# Patient Record
Sex: Male | Born: 1960 | Race: White | Hispanic: No | Marital: Married | Smoking: Current every day smoker
Health system: Southern US, Community
[De-identification: ages and names within clinical notes are randomized; demographics above are authoritative.]

## PROBLEM LIST (undated history)

## (undated) DIAGNOSIS — F329 Major depressive disorder, single episode, unspecified: Secondary | ICD-10-CM

## (undated) DIAGNOSIS — K635 Polyp of colon: Secondary | ICD-10-CM

## (undated) DIAGNOSIS — I209 Angina pectoris, unspecified: Secondary | ICD-10-CM

## (undated) DIAGNOSIS — F32A Depression, unspecified: Secondary | ICD-10-CM

## (undated) DIAGNOSIS — R519 Headache, unspecified: Secondary | ICD-10-CM

## (undated) DIAGNOSIS — K219 Gastro-esophageal reflux disease without esophagitis: Secondary | ICD-10-CM

## (undated) DIAGNOSIS — B019 Varicella without complication: Secondary | ICD-10-CM

## (undated) DIAGNOSIS — R51 Headache: Secondary | ICD-10-CM

## (undated) DIAGNOSIS — F419 Anxiety disorder, unspecified: Secondary | ICD-10-CM

## (undated) HISTORY — DX: Anxiety disorder, unspecified: F41.9

## (undated) HISTORY — DX: Headache, unspecified: R51.9

## (undated) HISTORY — PX: OTHER SURGICAL HISTORY: SHX169

## (undated) HISTORY — DX: Gastro-esophageal reflux disease without esophagitis: K21.9

## (undated) HISTORY — DX: Polyp of colon: K63.5

## (undated) HISTORY — DX: Varicella without complication: B01.9

## (undated) HISTORY — DX: Headache: R51

---

## 2001-07-23 ENCOUNTER — Encounter: Payer: Self-pay | Admitting: Internal Medicine

## 2001-07-23 ENCOUNTER — Ambulatory Visit (HOSPITAL_COMMUNITY): Admission: RE | Admit: 2001-07-23 | Discharge: 2001-07-23 | Payer: Self-pay | Admitting: Internal Medicine

## 2004-07-09 ENCOUNTER — Emergency Department (HOSPITAL_COMMUNITY): Admission: EM | Admit: 2004-07-09 | Discharge: 2004-07-10 | Payer: Self-pay | Admitting: Internal Medicine

## 2004-07-10 ENCOUNTER — Emergency Department (HOSPITAL_COMMUNITY): Admission: EM | Admit: 2004-07-10 | Discharge: 2004-07-10 | Payer: Self-pay | Admitting: Family Medicine

## 2005-07-16 ENCOUNTER — Encounter: Admission: RE | Admit: 2005-07-16 | Discharge: 2005-07-16 | Payer: Self-pay | Admitting: Occupational Medicine

## 2005-12-24 ENCOUNTER — Encounter: Admission: RE | Admit: 2005-12-24 | Discharge: 2005-12-24 | Payer: Self-pay | Admitting: Emergency Medicine

## 2008-09-14 DIAGNOSIS — K635 Polyp of colon: Secondary | ICD-10-CM

## 2008-09-14 HISTORY — DX: Polyp of colon: K63.5

## 2008-10-28 LAB — HM COLONOSCOPY

## 2009-01-20 ENCOUNTER — Emergency Department (HOSPITAL_COMMUNITY): Admission: EM | Admit: 2009-01-20 | Discharge: 2009-01-20 | Payer: Self-pay | Admitting: Emergency Medicine

## 2011-10-29 ENCOUNTER — Encounter: Payer: Self-pay | Admitting: Family Medicine

## 2011-10-29 ENCOUNTER — Ambulatory Visit (INDEPENDENT_AMBULATORY_CARE_PROVIDER_SITE_OTHER): Payer: BC Managed Care – PPO | Admitting: Family Medicine

## 2011-10-29 VITALS — BP 150/78 | HR 100 | Temp 98.8°F | Resp 78 | Ht 70.5 in | Wt 244.0 lb

## 2011-10-29 DIAGNOSIS — D126 Benign neoplasm of colon, unspecified: Secondary | ICD-10-CM

## 2011-10-29 DIAGNOSIS — F419 Anxiety disorder, unspecified: Secondary | ICD-10-CM

## 2011-10-29 DIAGNOSIS — F411 Generalized anxiety disorder: Secondary | ICD-10-CM

## 2011-10-29 DIAGNOSIS — K635 Polyp of colon: Secondary | ICD-10-CM

## 2011-10-29 DIAGNOSIS — K219 Gastro-esophageal reflux disease without esophagitis: Secondary | ICD-10-CM

## 2011-10-29 MED ORDER — SERTRALINE HCL 50 MG PO TABS: 50.0000 mg | ORAL_TABLET | Freq: Every day | ORAL | Status: DC

## 2011-10-29 NOTE — Progress Notes (Signed)
  Subjective:    Patient ID: Thomas Curtis, male    DOB: 1961/04/27, 51 y.o.   MRN: [id number]  HPI  New patient to establish care. Patient has history of GERD, benign colon polyps, ongoing nicotine use, and chronic anxiety. Was treated by previous physician with Celexa and as needed use of alprazolam. Has been off medication for about one year but is having some recent problems with increased anxiety. He relates some of this to work but also family stressors. Initially seemed to get good benefit from Celexa but this seemed to lose benefit over time. He denies history of depression. No history of alcohol or illicit drug abuse.  Long history of GERD. Drinks about 3 cups coffee per day. Also smokes one pack cigarettes per day. Occasionally alcohol 5-6 beers per day he but does not drink most days a week. No dysphagia.  Remote history of hematochezia. Colonoscopy revealed benign colon polyps. Positive family history of colon cancer in father.   Review of Systems  Constitutional: Negative for fever, chills, activity change and appetite change.  Respiratory: Negative for cough and shortness of breath.   Cardiovascular: Negative for chest pain, palpitations and leg swelling.  Gastrointestinal: Negative for abdominal pain.  Genitourinary: Negative for dysuria.  Neurological: Negative for dizziness and syncope.  Hematological: Negative for adenopathy.  Psychiatric/Behavioral: Negative for confusion.       Objective:   Physical Exam  Constitutional: He appears well-developed and well-nourished.  Neck: Neck supple. No thyromegaly present.  Cardiovascular: Normal rate and regular rhythm.   No murmur heard. Pulmonary/Chest: Effort normal and breath sounds normal. No respiratory distress. He has no wheezes. He has no rales.  Musculoskeletal: He exhibits no edema.  Lymphadenopathy:    He has no cervical adenopathy.  Psychiatric: He has a normal mood and affect. His behavior is normal.           Assessment & Plan:  #1 history of chronic anxiety. Question generalized anxiety. Start sertraline 50 mg daily. Reviewed possible side effects. Reassess one month  #2 history of chronic GERD. Discussed lifestyle modification. Scale back caffeine use.  Discussed importance of smoking cessation and alcohol in moderation.  #3 history of benign colon polyps  #4 history of chronic nicotine use. Patient has low motivation to stop at this time

## 2011-11-26 ENCOUNTER — Ambulatory Visit (INDEPENDENT_AMBULATORY_CARE_PROVIDER_SITE_OTHER): Payer: BC Managed Care – PPO | Admitting: Family Medicine

## 2011-11-26 ENCOUNTER — Encounter: Payer: Self-pay | Admitting: Family Medicine

## 2011-11-26 VITALS — BP 130/80 | HR 80 | Temp 98.4°F | Resp 12 | Ht 71.0 in | Wt 238.0 lb

## 2011-11-26 DIAGNOSIS — Z0000 Encounter for general adult medical examination without abnormal findings: Secondary | ICD-10-CM

## 2011-11-26 LAB — BASIC METABOLIC PANEL
BUN: 17 mg/dL (ref 6–23)
Calcium: 9.6 mg/dL (ref 8.4–10.5)
Chloride: 107 mEq/L (ref 96–112)
GFR: 61.04 mL/min (ref >60.00)
Glucose, Bld: 91 mg/dL (ref 70–99)
Potassium: 4.5 mEq/L (ref 3.5–5.1)
Sodium: 137 mEq/L (ref 135–145)

## 2011-11-26 LAB — CBC WITH DIFFERENTIAL/PLATELET
Basophils Absolute: 0 10*3/uL (ref 0.0–0.1)
Basophils Relative: 0.5 % (ref 0.0–3.0)
Eosinophils Absolute: 0.1 10*3/uL (ref 0.0–0.7)
Eosinophils Relative: 1.1 % (ref 0.0–5.0)
HCT: 45.9 % (ref 39.0–52.0)
Lymphocytes Relative: 21.7 % (ref 12.0–46.0)
Lymphs Abs: 1.3 10*3/uL (ref 0.7–4.0)
MCHC: 33.7 g/dL (ref 30.0–36.0)
MCV: 93 fl (ref 78.0–100.0)
Monocytes Absolute: 0.6 10*3/uL (ref 0.1–1.0)
Monocytes Relative: 9.6 % (ref 3.0–12.0)
Neutro Abs: 4.1 10*3/uL (ref 1.4–7.7)
Neutrophils Relative %: 67.1 % (ref 43.0–77.0)
RBC: 4.93 Mil/uL (ref 4.22–5.81)
RDW: 13.3 % (ref 11.5–14.6)
WBC: 6.1 10*3/uL (ref 4.5–10.5)

## 2011-11-26 LAB — LIPID PANEL
Cholesterol: 210 mg/dL — ABNORMAL HIGH (ref 0–200)
HDL: 46.4 mg/dL (ref >39.00)
Total CHOL/HDL Ratio: 5
VLDL: 13.2 mg/dL (ref 0.0–40.0)

## 2011-11-26 LAB — HEPATIC FUNCTION PANEL
ALT: 24 U/L (ref 0–53)
AST: 26 U/L (ref 0–37)
Albumin: 4.4 g/dL (ref 3.5–5.2)
Alkaline Phosphatase: 73 U/L (ref 39–117)
Bilirubin, Direct: 0.1 mg/dL (ref 0.0–0.3)
Total Bilirubin: 0.6 mg/dL (ref 0.3–1.2)
Total Protein: 7 g/dL (ref 6.0–8.3)

## 2011-11-26 LAB — TSH: TSH: 1.16 u[IU]/mL (ref 0.35–5.50)

## 2011-11-26 LAB — LDL CHOLESTEROL, DIRECT: Direct LDL: 141.1 mg/dL

## 2011-11-26 LAB — PSA: PSA: 0.39 ng/mL (ref 0.10–4.00)

## 2011-11-26 MED ORDER — PANTOPRAZOLE SODIUM 40 MG PO TBEC: 40.0000 mg | DELAYED_RELEASE_TABLET | Freq: Every day | ORAL | Status: DC

## 2011-11-26 NOTE — Progress Notes (Signed)
  Subjective:    Patient ID: Thomas Curtis, male    DOB: 1961/01/21, 51 y.o.   MRN: [id number]  HPI  Complete physical. Patient seen last visit with anxiety and greatly improved with sertraline. His early morning vomiting went completely away after starting sertraline. He feels less anxious. Sleep improved. Less irritable. Long history of reflux currently treated omeprazole. He would like to consider generic prescription options. Reflux well controlled. Still smokes about one pack per day and considering over the counter assistance such as nicotine gum or patches.  Father had colon cancer. Patient's last colonoscopy 2010. He has had previous polyps. He thinks he is due for repeat this year. Patient declines Pneumovax. Tetanus 2 years ago.  Past Medical History  Diagnosis Date  . Chicken pox   . GERD (gastroesophageal reflux disease)   . Colon polyps 2010  . Anxiety    No past surgical history on file.  reports that he has been smoking Cigarettes.  He has a 35 pack-year smoking history. He does not have any smokeless tobacco history on file. His alcohol and drug histories not on file. family history includes Cancer in his father and Hypertension in his father and mother. No Known Allergies    Review of Systems  Constitutional: Negative for fever, activity change, appetite change and fatigue.  HENT: Negative for ear pain, congestion and trouble swallowing.   Eyes: Negative for pain and visual disturbance.  Respiratory: Negative for cough, shortness of breath and wheezing.   Cardiovascular: Negative for chest pain and palpitations.  Gastrointestinal: Negative for nausea, vomiting, abdominal pain, diarrhea, constipation, blood in stool, abdominal distention and rectal pain.  Genitourinary: Negative for dysuria, hematuria and testicular pain.  Musculoskeletal: Negative for joint swelling and arthralgias.  Skin: Negative for rash.  Neurological: Negative for dizziness, syncope and  headaches.  Hematological: Negative for adenopathy.  Psychiatric/Behavioral: Negative for confusion and dysphoric mood.       Objective:   Physical Exam  Constitutional: He is oriented to person, place, and time. He appears well-developed and well-nourished. No distress.  HENT:  Head: Normocephalic and atraumatic.  Right Ear: External ear normal.  Left Ear: External ear normal.  Mouth/Throat: Oropharynx is clear and moist.  Eyes: Conjunctivae and EOM are normal. Pupils are equal, round, and reactive to light.  Neck: Normal range of motion. Neck supple. No thyromegaly present.  Cardiovascular: Normal rate, regular rhythm and normal heart sounds.   No murmur heard. Pulmonary/Chest: No respiratory distress. He has no wheezes. He has no rales.  Abdominal: Soft. Bowel sounds are normal. He exhibits no distension and no mass. There is no tenderness. There is no rebound and no guarding.  Musculoskeletal: He exhibits no edema.  Lymphadenopathy:    He has no cervical adenopathy.  Neurological: He is alert and oriented to person, place, and time. He displays normal reflexes. No cranial nerve deficit.  Skin: No rash noted.  Psychiatric: He has a normal mood and affect. His behavior is normal. Judgment and thought content normal.          Assessment & Plan:  Complete physical. Patient encouraged to continue with regular colonoscopies as recommended by GI. Smoking cessation discussed. He is considering over-the-counter methods. Pneumovax recommended but declined. Change to Protonix 40 mg once daily

## 2011-11-27 NOTE — Progress Notes (Signed)
Quick Note:  Pt informed on personally identified VM ______ 

## 2012-02-12 ENCOUNTER — Encounter: Payer: Self-pay | Admitting: Family Medicine

## 2012-02-12 ENCOUNTER — Ambulatory Visit (INDEPENDENT_AMBULATORY_CARE_PROVIDER_SITE_OTHER): Payer: BC Managed Care – PPO | Admitting: Family Medicine

## 2012-02-12 VITALS — BP 120/80 | Temp 98.7°F | Wt 244.0 lb

## 2012-02-12 DIAGNOSIS — L259 Unspecified contact dermatitis, unspecified cause: Secondary | ICD-10-CM

## 2012-02-12 MED ORDER — METHYLPREDNISOLONE ACETATE 80 MG/ML IJ SUSP
120.0000 mg | Freq: Once | INTRAMUSCULAR | Status: AC
  Administered 2012-02-12: 120 mg via INTRAMUSCULAR

## 2012-02-12 MED ORDER — PREDNISONE 10 MG PO TABS: ORAL_TABLET | ORAL | Status: DC

## 2012-02-12 NOTE — Progress Notes (Signed)
  Subjective:    Patient ID: Thomas Curtis, male    DOB: [birth year], 51 y.o.   MRN: [id number]  HPI  Extensive pruritic vesicular rash. History of similar rash of contact dermatitis in the past. No fever or chills. Using calamine lotion with minimal relief. Onset about 3 days ago. Generally spreading since then. No alleviating factors. Symptoms of itching worse at night. Has used prednisone by injection and orally in the past which has helped.   Review of Systems  Constitutional: Negative for fever and chills.  Skin: Positive for rash.       Objective:   Physical Exam  Constitutional: He appears well-developed and well-nourished.  Cardiovascular: Normal rate and regular rhythm.   Pulmonary/Chest: Effort normal and breath sounds normal. No respiratory distress. He has no wheezes. He has no rales.  Skin: Rash noted.       Patient has multiple areas of rash which are slightly raised and vesicular and nontender including face arms legs and trunk          Assessment & Plan:  Contact dermatitis with widespread distribution. Depo-Medrol 80 mg IM. Oral prednisone taper if needed.

## 2012-02-12 NOTE — Patient Instructions (Signed)

## 2012-04-12 ENCOUNTER — Encounter: Payer: Self-pay | Admitting: Family Medicine

## 2012-04-12 ENCOUNTER — Ambulatory Visit (INDEPENDENT_AMBULATORY_CARE_PROVIDER_SITE_OTHER): Payer: BC Managed Care – PPO | Admitting: Family Medicine

## 2012-04-12 VITALS — BP 130/80 | HR 72 | Temp 98.0°F | Resp 12 | Wt 246.0 lb

## 2012-04-12 DIAGNOSIS — K219 Gastro-esophageal reflux disease without esophagitis: Secondary | ICD-10-CM

## 2012-04-12 DIAGNOSIS — R0789 Other chest pain: Secondary | ICD-10-CM

## 2012-04-12 NOTE — Progress Notes (Signed)
  Subjective:    Patient ID: Thomas Curtis, male    DOB: 05/11/1961, 51 y.o.   MRN: [id number]  HPI  Patient seen with onset about 24 hours ago of intermittent very transient chest tightness which usually last about 7-8 seconds. Questionable mild shortness of breath. Nausea without vomiting. No cardiac history. Location is substernal. No radiation. Severity is 1-2/10. No recent exertional symptoms. No clear exacerbating factors. He does have history of reflux on Protonix regularly. Recently placed by dentist on clindamycin and penicillin V. Couple of occasions felt antibiotic was not going down well. Occasional pain with swallowing. Denies appetite or weight loss.  Risk factors for CAD including age and smoking status. History of mild hyperlipidemia with recent LDL 141. No history of diabetes. No history of hypertension. Increased stress with work and family situation.  Past Medical History  Diagnosis Date  . Chicken pox   . GERD (gastroesophageal reflux disease)   . Colon polyps 2010  . Anxiety    No past surgical history on file.  reports that he has been smoking Cigarettes.  He has a 35 pack-year smoking history. He does not have any smokeless tobacco history on file. His alcohol and drug histories not on file. family history includes Cancer in his father and Hypertension in his father and mother. No Known Allergies    Review of Systems  Constitutional: Negative for fever, chills, appetite change and unexpected weight change.  HENT: Negative for trouble swallowing and voice change.   Respiratory: Negative for cough.   Cardiovascular: Positive for chest pain. Negative for palpitations and leg swelling.  Gastrointestinal: Positive for nausea. Negative for vomiting, abdominal pain and diarrhea.  Neurological: Negative for dizziness, syncope and weakness.       Objective:   Physical Exam  Constitutional: He is oriented to person, place, and time. He appears well-developed and  well-nourished.  Neck: Neck supple. No thyromegaly present.  Cardiovascular: Normal rate and regular rhythm.  Exam reveals no gallop.   Pulmonary/Chest: Effort normal and breath sounds normal. No respiratory distress. He has no wheezes. He has no rales.  Abdominal: Soft. Bowel sounds are normal. He exhibits no distension and no mass. There is no tenderness. There is no rebound and no guarding.  Musculoskeletal: He exhibits no edema.  Neurological: He is alert and oriented to person, place, and time.          Assessment & Plan:  Patient presents with somewhat atypical chest pain. Atypical features including at rest and lasting only 8-10 seconds. He does have moderate risk factors including smoking status, age, and mild hyperlipidemia. EKG today reveals sinus rhythm with no acute changes. Start baby aspirin. Schedule exercise tolerance test. If this is unrevealing and symptoms persist consider GI referral for possible EGD. Continue Protonix.

## 2012-04-12 NOTE — Patient Instructions (Addendum)
Start baby aspirin (81 mg) one daily Follow up immediately for any worsening chest pain or increased shortness of breath. We will call you with stress test appointment.

## 2012-04-22 ENCOUNTER — Encounter: Payer: BC Managed Care – PPO | Admitting: Physician Assistant

## 2012-05-09 ENCOUNTER — Encounter: Payer: BC Managed Care – PPO | Admitting: Physician Assistant

## 2012-08-22 ENCOUNTER — Telehealth: Payer: Self-pay | Admitting: Family Medicine

## 2012-08-22 MED ORDER — PANTOPRAZOLE SODIUM 40 MG PO TBEC: 40.0000 mg | DELAYED_RELEASE_TABLET | Freq: Every day | ORAL | Status: DC

## 2012-08-22 NOTE — Telephone Encounter (Signed)
Pt is on acid reflux meds, and he has run out. Has been taking OTC because he had no more refills. Wondering if he can get more refills called in to get him through 1.1.2014 - no money for office copay until after Xmas. [facility] [address] pharmacy)

## 2012-08-22 NOTE — Telephone Encounter (Signed)
Wife informed Rx sent

## 2012-12-19 ENCOUNTER — Encounter: Payer: Self-pay | Admitting: Family Medicine

## 2012-12-19 ENCOUNTER — Ambulatory Visit (INDEPENDENT_AMBULATORY_CARE_PROVIDER_SITE_OTHER): Payer: BC Managed Care – PPO | Admitting: Family Medicine

## 2012-12-19 VITALS — BP 120/72 | Temp 98.4°F

## 2012-12-19 DIAGNOSIS — M545 Low back pain: Secondary | ICD-10-CM

## 2012-12-19 DIAGNOSIS — M533 Sacrococcygeal disorders, not elsewhere classified: Secondary | ICD-10-CM

## 2012-12-19 MED ORDER — SERTRALINE HCL 50 MG PO TABS: 50.0000 mg | ORAL_TABLET | Freq: Every day | ORAL | Status: DC

## 2012-12-19 MED ORDER — PREDNISONE 10 MG PO TABS: ORAL_TABLET | ORAL | Status: DC

## 2012-12-19 MED ORDER — HYDROCODONE-ACETAMINOPHEN 5-325 MG PO TABS: ORAL_TABLET | ORAL | Status: DC

## 2012-12-19 MED ORDER — PANTOPRAZOLE SODIUM 40 MG PO TBEC: 40.0000 mg | DELAYED_RELEASE_TABLET | Freq: Every day | ORAL | Status: DC

## 2012-12-19 NOTE — Progress Notes (Signed)
  Subjective:    Patient ID: Thomas Curtis, male    DOB: 11-18-1960, 52 y.o.   MRN: [id number]  HPI Long hx of intermittent low back pain Onset Saturday of LBP-woke up with pain. Mid to right low lumbar. No radiation.  ?occasional numbness but no weakness Ibuprofen and Icy Hot without relief.   Sharp stabbing pain.  Severity 7/10. Worse with change of position. Denies recent appetite or weight change. No urine or stool incontinence.  Past Medical History  Diagnosis Date  . Chicken pox   . GERD (gastroesophageal reflux disease)   . Colon polyps 2010  . Anxiety    No past surgical history on file.  reports that he has been smoking Cigarettes.  He has a 35 pack-year smoking history. He does not have any smokeless tobacco history on file. His alcohol and drug histories are not on file. family history includes Cancer in his father and Hypertension in his father and mother. No Known Allergies'    Review of Systems  Constitutional: Negative for fever, activity change, appetite change and unexpected weight change.  Respiratory: Negative for cough and shortness of breath.   Cardiovascular: Negative for chest pain and leg swelling.  Gastrointestinal: Negative for vomiting and abdominal pain.  Genitourinary: Negative for dysuria, hematuria and flank pain.  Musculoskeletal: Positive for back pain. Negative for joint swelling.  Neurological: Negative for weakness and numbness.       Objective:   Physical Exam  Constitutional: He appears well-developed and well-nourished.  Cardiovascular: Normal rate and regular rhythm.   Pulmonary/Chest: Effort normal and breath sounds normal. No respiratory distress. He has no wheezes. He has no rales.  Musculoskeletal: He exhibits no edema.  Straight leg raises are negative bilaterally Minimal tenderness right lower lumbar region. He has pain with flexing back greater than about 60  Neurological:  Full Strength lower extremities Deep  tender reflexes 2+ knee and ankle bilaterally. Gait normal.          Assessment & Plan:  Lumbosacral back pain. Nonfocal neurologic exam. Brief prednisone taper and Vicodin 5/325 mg one to 2 every 6 hours as needed for severe pain. Continue ice or heat for symptomatic relief. Stretches given. Touch base 2 weeks if no better. Consider for physical therapy then if no improvement

## 2012-12-19 NOTE — Patient Instructions (Signed)

## 2013-03-09 ENCOUNTER — Telehealth: Payer: Self-pay | Admitting: Family Medicine

## 2013-03-09 ENCOUNTER — Encounter (HOSPITAL_COMMUNITY): Payer: Self-pay | Admitting: *Deleted

## 2013-03-09 ENCOUNTER — Observation Stay (HOSPITAL_COMMUNITY)
Admission: EM | Admit: 2013-03-09 | Discharge: 2013-03-10 | Disposition: A | Discharge status: Home / Self Care | Payer: BC Managed Care – PPO | Attending: Cardiovascular Disease | Admitting: Cardiovascular Disease

## 2013-03-09 ENCOUNTER — Emergency Department (HOSPITAL_COMMUNITY): Payer: BC Managed Care – PPO

## 2013-03-09 DIAGNOSIS — R112 Nausea with vomiting, unspecified: Secondary | ICD-10-CM | POA: Insufficient documentation

## 2013-03-09 DIAGNOSIS — R0602 Shortness of breath: Secondary | ICD-10-CM | POA: Insufficient documentation

## 2013-03-09 DIAGNOSIS — R61 Generalized hyperhidrosis: Secondary | ICD-10-CM | POA: Insufficient documentation

## 2013-03-09 DIAGNOSIS — K219 Gastro-esophageal reflux disease without esophagitis: Secondary | ICD-10-CM

## 2013-03-09 DIAGNOSIS — F17200 Nicotine dependence, unspecified, uncomplicated: Secondary | ICD-10-CM | POA: Insufficient documentation

## 2013-03-09 DIAGNOSIS — R079 Chest pain, unspecified: Secondary | ICD-10-CM

## 2013-03-09 DIAGNOSIS — R072 Precordial pain: Principal | ICD-10-CM | POA: Diagnosis present

## 2013-03-09 DIAGNOSIS — Z79899 Other long term (current) drug therapy: Secondary | ICD-10-CM | POA: Insufficient documentation

## 2013-03-09 DIAGNOSIS — F411 Generalized anxiety disorder: Secondary | ICD-10-CM | POA: Insufficient documentation

## 2013-03-09 HISTORY — DX: Depression, unspecified: F32.A

## 2013-03-09 HISTORY — DX: Major depressive disorder, single episode, unspecified: F32.9

## 2013-03-09 HISTORY — DX: Angina pectoris, unspecified: I20.9

## 2013-03-09 LAB — COMPREHENSIVE METABOLIC PANEL
ALT: 17 U/L (ref 0–53)
AST: 23 U/L (ref 0–37)
Albumin: 4.1 g/dL (ref 3.5–5.2)
Alkaline Phosphatase: 99 U/L (ref 39–117)
BUN: 17 mg/dL (ref 6–23)
CO2: 23 mEq/L (ref 19–32)
Chloride: 104 mEq/L (ref 96–112)
Creatinine, Ser: 1.21 mg/dL (ref 0.50–1.35)
GFR calc non Af Amer: 67 mL/min — ABNORMAL LOW (ref >90)
Potassium: 4.1 mEq/L (ref 3.5–5.1)
Sodium: 138 mEq/L (ref 135–145)
Total Bilirubin: 0.3 mg/dL (ref 0.3–1.2)
Total Protein: 7.2 g/dL (ref 6.0–8.3)

## 2013-03-09 LAB — CBC
HCT: 43.3 % (ref 39.0–52.0)
MCH: 31.6 pg (ref 26.0–34.0)
MCHC: 35.6 g/dL (ref 30.0–36.0)
MCV: 88.7 fL (ref 78.0–100.0)
Platelets: 252 10*3/uL (ref 150–400)
RBC: 4.88 MIL/uL (ref 4.22–5.81)
RDW: 13.4 % (ref 11.5–15.5)
WBC: 7.7 10*3/uL (ref 4.0–10.5)

## 2013-03-09 LAB — PRO B NATRIURETIC PEPTIDE: Pro B Natriuretic peptide (BNP): 94.9 pg/mL (ref 0–125)

## 2013-03-09 LAB — POCT I-STAT TROPONIN I: Troponin i, poc: 0 ng/mL (ref 0.00–0.08)

## 2013-03-09 LAB — LIPASE, BLOOD: Lipase: 22 U/L (ref 11–59)

## 2013-03-09 LAB — TROPONIN I: Troponin I: <0.3 ng/mL (ref <0.30)

## 2013-03-09 MED ORDER — LORAZEPAM 2 MG/ML IJ SOLN: 1.0000 mg | Freq: Once | INTRAMUSCULAR | Status: DC

## 2013-03-09 MED ORDER — NITROGLYCERIN 0.4 MG SL SUBL: 0.4000 mg | SUBLINGUAL_TABLET | SUBLINGUAL | Status: DC | PRN

## 2013-03-09 MED ORDER — ASPIRIN 81 MG PO CHEW
324.0000 mg | CHEWABLE_TABLET | Freq: Once | ORAL | Status: AC
  Administered 2013-03-09: 324 mg via ORAL
  Filled 2013-03-09: qty 4

## 2013-03-09 MED ORDER — ACETAMINOPHEN 325 MG PO TABS: 650.0000 mg | ORAL_TABLET | ORAL | Status: DC | PRN

## 2013-03-09 MED ORDER — ALPRAZOLAM 0.25 MG PO TABS: 0.2500 mg | ORAL_TABLET | Freq: Two times a day (BID) | ORAL | Status: DC | PRN

## 2013-03-09 MED ORDER — SODIUM CHLORIDE 0.9 % IJ SOLN
3.0000 mL | Freq: Two times a day (BID) | INTRAMUSCULAR | Status: DC
  Administered 2013-03-09: 3 mL via INTRAVENOUS

## 2013-03-09 MED ORDER — NICOTINE 21 MG/24HR TD PT24
21.0000 mg | MEDICATED_PATCH | Freq: Once | TRANSDERMAL | Status: DC
  Administered 2013-03-09: 21 mg via TRANSDERMAL
  Filled 2013-03-09: qty 1

## 2013-03-09 MED ORDER — LORAZEPAM 1 MG PO TABS
0.5000 mg | ORAL_TABLET | Freq: Four times a day (QID) | ORAL | Status: DC | PRN
  Administered 2013-03-09: 1 mg via ORAL
  Filled 2013-03-09: qty 1

## 2013-03-09 MED ORDER — SODIUM CHLORIDE 0.9 % IV SOLN: 250.0000 mL | INTRAVENOUS | Status: DC | PRN

## 2013-03-09 MED ORDER — ZOLPIDEM TARTRATE 5 MG PO TABS: 5.0000 mg | ORAL_TABLET | Freq: Every evening | ORAL | Status: DC | PRN

## 2013-03-09 MED ORDER — ENOXAPARIN SODIUM 40 MG/0.4ML ~~LOC~~ SOLN
40.0000 mg | SUBCUTANEOUS | Status: DC
  Filled 2013-03-09 (×2): qty 0.4

## 2013-03-09 MED ORDER — SODIUM CHLORIDE 0.9 % IJ SOLN: 3.0000 mL | INTRAMUSCULAR | Status: DC | PRN

## 2013-03-09 MED ORDER — NICOTINE 14 MG/24HR TD PT24
14.0000 mg | MEDICATED_PATCH | Freq: Every day | TRANSDERMAL | Status: DC
  Filled 2013-03-09: qty 1

## 2013-03-09 MED ORDER — ASPIRIN EC 81 MG PO TBEC
81.0000 mg | DELAYED_RELEASE_TABLET | Freq: Every day | ORAL | Status: DC
  Filled 2013-03-09: qty 1

## 2013-03-09 MED ORDER — SODIUM CHLORIDE 0.9 % IV BOLUS (SEPSIS): 500.0000 mL | Freq: Once | INTRAVENOUS | Status: DC

## 2013-03-09 MED ORDER — SERTRALINE HCL 50 MG PO TABS
50.0000 mg | ORAL_TABLET | Freq: Every day | ORAL | Status: DC
  Filled 2013-03-09: qty 1

## 2013-03-09 MED ORDER — TRAMADOL HCL 50 MG PO TABS: 50.0000 mg | ORAL_TABLET | Freq: Four times a day (QID) | ORAL | Status: DC | PRN

## 2013-03-09 MED ORDER — PANTOPRAZOLE SODIUM 40 MG PO TBEC
40.0000 mg | DELAYED_RELEASE_TABLET | Freq: Every day | ORAL | Status: DC
  Administered 2013-03-09 (×2): 40 mg via ORAL
  Filled 2013-03-09 (×2): qty 1

## 2013-03-09 MED ORDER — ONDANSETRON HCL 4 MG/2ML IJ SOLN: 4.0000 mg | Freq: Four times a day (QID) | INTRAMUSCULAR | Status: DC | PRN

## 2013-03-09 NOTE — ED Notes (Signed)
Victorino Dike, PA back at the bedside.

## 2013-03-09 NOTE — Telephone Encounter (Signed)
FYI: Pt arrived at [facility] at 11:04a/[facility]

## 2013-03-09 NOTE — ED Provider Notes (Signed)
History    CSN: [id number] Arrival date & time 03/09/13  1104  First MD Initiated Contact with Patient 03/09/13 1118     Chief Complaint  Patient presents with  . Chest Pain   (Consider location/radiation/quality/duration/timing/severity/associated sxs/prior Treatment) Patient is a 52 y.o. male presenting with chest pain.  Chest Pain Associated symptoms: diaphoresis, headache, nausea, shortness of breath and vomiting   Associated symptoms: no back pain     Patient is a 53 year old male past medical history significant for anxiety, cigarette smoker, and GERD presented to the emergency department for acute onset central dull constant chest pain with radiation to right side of chest and the left side of the abdomen. Patient rates pain 3/10. Patient states he is associated nausea, nonbloody nonbilious vomiting, diaphoresis but the symptoms have resolved prior to arrival to the ED. Patient states he is still having some lingering shortness of breath and numbness in left arm and left leg these symptoms are also improving at this time. Patient states he had chest pain similar to this one year ago when he had an echo and stress test performed with negative workup. Patient does not have the cardiologist. Perc negative.  Past Medical History  Diagnosis Date  . Chicken pox   . GERD (gastroesophageal reflux disease)   . Colon polyps 2010  . Anxiety    Past Surgical History  Procedure Laterality Date  . None     Family History  Problem Relation Age of Onset  . Hypertension Mother   . Hypertension Father   . Cancer Father     colon, lung, prostate   History  Substance Use Topics  . Smoking status: Current Every Day Smoker -- 1.00 packs/day for 35 years    Types: Cigarettes  . Smokeless tobacco: Not on file  . Alcohol Use: Yes     Comment: Does no abuse    Review of Systems  Constitutional: Positive for diaphoresis.  HENT: Negative for neck pain.   Eyes: Negative for pain.   Respiratory: Positive for shortness of breath.   Cardiovascular: Positive for chest pain. Negative for leg swelling.  Gastrointestinal: Positive for nausea and vomiting.  Genitourinary: Negative.   Musculoskeletal: Negative for back pain.  Skin: Negative.   Neurological: Positive for headaches.    Allergies  Review of patient's allergies indicates no known allergies.  Home Medications   Current Outpatient Rx  Name  Route  Sig  Dispense  Refill  . pantoprazole (PROTONIX) 40 MG tablet   Oral   Take 1 tablet (40 mg total) by mouth daily.   90 tablet   3   . sertraline (ZOLOFT) 50 MG tablet   Oral   Take 1 tablet (50 mg total) by mouth daily.   30 tablet   5    BP 133/79  Pulse 61  Temp(Src) 97.8 F (36.6 C) (Oral)  Resp 14  SpO2 96% Physical Exam  Constitutional: He is oriented to person, place, and time. He appears well-developed and well-nourished. No distress.  HENT:  Head: Normocephalic and atraumatic.  Mouth/Throat: Oropharynx is clear and moist.  Eyes: Conjunctivae and EOM are normal. Pupils are equal, round, and reactive to light.  Neck: Neck supple.  Cardiovascular: Normal rate, regular rhythm, normal heart sounds and intact distal pulses.   Pulmonary/Chest: Effort normal and breath sounds normal. No respiratory distress. He has no rales. He exhibits no tenderness.  Abdominal: Soft. Bowel sounds are normal. There is no tenderness.  Neurological: He is alert  and oriented to person, place, and time. He has normal strength. No cranial nerve deficit or sensory deficit. GCS eye subscore is 4. GCS verbal subscore is 5. GCS motor subscore is 6.  Skin: Skin is warm and dry. He is not diaphoretic.    ED Course  Procedures (including critical care time)   Date: 03/09/2013  Rate: 84  Rhythm: normal sinus rhythm  QRS Axis: normal  Intervals: normal  ST/T Wave abnormalities: normal  Conduction Disutrbances:none  Narrative Interpretation:   Old EKG Reviewed:  unchanged    Labs Reviewed  COMPREHENSIVE METABOLIC PANEL - Abnormal; Notable for the following:    GFR calc non Af Amer 67 (*)    GFR calc Af Amer 77 (*)    All other components within normal limits  CBC  LIPASE, BLOOD  PRO B NATRIURETIC PEPTIDE  POCT I-STAT TROPONIN I  POCT I-STAT TROPONIN I   Dg Chest 2 View  03/09/2013   *RADIOLOGY REPORT*  Clinical Data: Chest and left arm pain, smoking history  CHEST - 2 VIEW  Comparison: Chest x-ray of 01/20/2009  Findings: No active infiltrate or effusion is seen.  Mediastinal contours are stable.  The heart is within normal limits in size. No bony abnormality is noted.  Widening of the right ECA 2010 is noted.  IMPRESSION: Stable chest x-ray.  No active lung disease.   Original Report Authenticated By: Dwyane Dee, M.D.   1. Chest pain     MDM  Concern for cardiac etiology of Chest Pain. Cardiology has been consulted and will admit. Pt does not meet criteria for CP protocol and a further evaluation is recommended. No acute abnormalities found on EKG and first two rounds of cardiac enzymes negative. This case was discussed with Dr. Rosalia Hammers who has seen the patient and agrees with plan to admit. The patient appears reasonably stabilized for admission considering the current resources, flow, and capabilities available in the ED at this time, and I doubt any other [facility] requiring further screening and/or treatment in the ED prior to admission.       Jeannetta Ellis, PA-C 03/09/13 2028

## 2013-03-09 NOTE — ED Notes (Signed)
Victorino Dike, PA at the bedside.

## 2013-03-09 NOTE — ED Notes (Signed)
Pt resting comfortably. States he thinks it was mainly just stress related. Still reports some dizziness but no pain.

## 2013-03-09 NOTE — ED Notes (Signed)
Pt states he does not want [facility] NTG at this time because he already has a headache.

## 2013-03-09 NOTE — ED Notes (Signed)
Please contact Dennie Bible, pt's wife, if needed at this number [phone].

## 2013-03-09 NOTE — Telephone Encounter (Signed)
Caller Name: Dennie Bible  Phone: [phone]  Patient: Thomas Curtis  Gender: Male  DOB: [birth year]  Age: 52 Years  PCP: Evelena Peat [facility])   Does the office need to follow up with this patient?: No  RN Note:  patient at work. Patient at work, ongoing symptoms, prefers to be seen in office.   Reason For Call & Symptoms: emergent call, chest pain, left arm numb, nausea  Reviewed Health History In EMR: Yes  Reviewed Medications In EMR: Yes  Reviewed Allergies In EMR: Yes  Reviewed Surgeries / Procedures: Yes  Date of Onset of Symptoms: 03/09/2013  Guideline(s) Used:  Chest Pain  Disposition Per Guideline:  Call EMS 911 Now  Reason For Disposition Reached:  Chest pain lasting longer than 5 minutes and ANY of the following: Over 31 years old Pain is crushing, pressure-like, or heavy   Advice Given:  Call Back If:  You become worse.  Patient Will Follow Care Advice:  YES

## 2013-03-09 NOTE — ED Notes (Addendum)
Pt reports nausea and vomiting that started this morning.  States that he has substernal CP that started this morning.  Pt also reports that he has had (L) arm numbness that started a week ago.  States that his (L) leg went numb this morning.  Pt ambulatory.  No SOB.  Reports diaphoresis.  Pt appears very anxious-hx of same.

## 2013-03-09 NOTE — H&P (Signed)
History and Physical   Patient ID: Thomas Curtis MRN: [id number], DOB/AGE: June 05, 1961 52 y.o. Date of Encounter: 03/09/2013  Primary Physician: Kristian Covey, MD Primary Cardiologist: New  Chief Complaint:  Chest pain  HPI: Thomas Curtis is a 52 y.o. male with no history of CAD. He has been having problems with periodic left arm numbness for 2 weeks. He was otherwise in his usual state of health yesterday when he had 2 episodes of diarrhea. This am, he was already up at 4 am when he had onset of upper left chest, upper right chest and lateral left chest pain. Dull pain, 3/10 at its worst. Associated with SOB, some diaphoresis and nausea. Went to work this am and pain became worse. Left work and came to the ER.   In the ER his pain is still a dull ache in the upper right and parts of his chest in the left lateral part of his chest. It does not change significantly with deep inspiration but did get worse with exertion. In route to the hospital he vomited 3 times. He has not had any more diarrhea. He has a history of a pinched nerve in the left part of his back and has also been having some mild numbness in his left leg today. Although he complains of numbness in his left arm he has adequate sensation and equal bilateral movements. He feels that he is dealing with a great deal of stress at work and at home and feels like he never gets break. He denies reflux symptoms, hemoptysis, hematemesis or melena. He has some sharp tingling pain at times in various parts of his body including both arms. He is also having a headache.   Past Medical History  Diagnosis Date  . Chicken pox   . GERD (gastroesophageal reflux disease)   . Colon polyps 2010  . Anxiety     Surgical History:  Past Surgical History  Procedure Laterality Date  . None       I have reviewed the patient's current medications. Prior to Admission medications   Medication Sig Start Date End Date Taking? Authorizing  Provider  pantoprazole (PROTONIX) 40 MG tablet Take 1 tablet (40 mg total) by mouth daily. 12/19/12 12/19/13 Yes Kristian Covey, MD  sertraline (ZOLOFT) 50 MG tablet Take 1 tablet (50 mg total) by mouth daily. 12/19/12 12/19/13 Yes Kristian Covey, MD   Scheduled Meds: . LORazepam  1 mg Intravenous Once  . nicotine  21 mg Transdermal Once   Continuous Infusions: . sodium chloride     PRN Meds:.nitroGLYCERIN  Allergies: No Known Allergies  History   Social History  . Marital Status: Single    Spouse Name: N/A    Number of Children: N/A  . Years of Education: N/A   Occupational History  . CARPENTER at [employer]    Social History Main Topics  . Smoking status: Current Every Day Smoker -- 1.00 packs/day for 35 years    Types: Cigarettes  . Smokeless tobacco: Not on file  . Alcohol Use: Yes     Comment: Does no abuse  . Drug Use: No  . Sexually Active: Not on file   Social History Narrative  .  lives with family at home and feels the situation is very stressful.     Family History  Problem Relation Age of Onset  . Hypertension Mother   . Hypertension Father   . Cancer Father     colon, lung, prostate  Family Status  Relation Status Death Age  . Mother Alive     Born in 17  . Father Alive     Born in 1919    Review of Systems:   Full 14-point review of systems otherwise negative except as noted above.  Physical Exam: Blood pressure 141/80, pulse 61, temperature 98.4 F (36.9 C), temperature source Oral, resp. rate 18, SpO2 98.00%. General: Well developed, well nourished,male in no acute distress. Head: Normocephalic, atraumatic, sclera non-icteric, no xanthomas, nares are without discharge. Dentition: Good Neck: No carotid bruits. JVD not elevated. No thyromegally Lungs: Good expansion bilaterally. without wheezes or rhonchi.  Heart: IRRegular rate and rhythm with S1 S2.  No S3 or S4.  No murmur, no rubs, or gallops appreciated. Abdomen: Soft, non-tender,  non-distended with normoactive bowel sounds. No hepatomegaly. No rebound/guarding. No obvious abdominal masses. Msk:  Strength and tone appear normal for age. No joint deformities or effusions, no spine or costo-vertebral angle tenderness. Extremities: No clubbing or cyanosis. No edema.  Distal pedal pulses are 2+ in 4 extrem Neuro: Alert and oriented X 3. Moves all extremities spontaneously. No focal deficits noted. Psych:  Responds to questions appropriately with a normal affect. Skin: No rashes or lesions noted  Labs:   Lab Results  Component Value Date   WBC 7.7 03/09/2013   HGB 15.4 03/09/2013   HCT 43.3 03/09/2013   MCV 88.7 03/09/2013   PLT 252 03/09/2013   No results found for this basename: INR,  in the last 72 hours   Recent Labs Lab 03/09/13 1130  NA 138  K 4.1  CL 104  CO2 23  BUN 17  CREATININE 1.21  CALCIUM 9.5  PROT 7.2  BILITOT 0.3  ALKPHOS 99  ALT 17  AST 23  GLUCOSE 91    Recent Labs  03/09/13 1151 03/09/13 1432  TROPIPOC 0.00 0.00   Lab Results  Component Value Date   CHOL 210* 11/26/2011   HDL 46.40 11/26/2011   TRIG 66.0 11/26/2011   Pro B Natriuretic peptide (BNP)  Date/Time Value Range Status  03/09/2013 11:30 AM 94.9  0 - 125 pg/mL Final   Radiology/Studies: Dg Chest 2 View 03/09/2013   *RADIOLOGY REPORT*  Clinical Data: Chest and left arm pain, smoking history  CHEST - 2 VIEW  Comparison: Chest x-ray of 01/20/2009  Findings: No active infiltrate or effusion is seen.  Mediastinal contours are stable.  The heart is within normal limits in size. No bony abnormality is noted.  Widening of the right ECA 2010 is noted.  IMPRESSION: Stable chest x-ray.  No active lung disease.   Original Report Authenticated By: Dwyane Dee, M.D.   ECG:  Sinus rhythm, no acute ischemic changes  ASSESSMENT AND PLAN:  Principal Problem:   Precordial pain - admit, rule out MI. At heparin if cardiac enzymes become elevated. Otherwise, use DVT Lovenox.   Schedule a  stress Cardiolite in a.m. as long as cardiac enzymes remain negative.   Add a nicotine patch to help with smoking cessation as well as pain control medications.   Use when necessary Ativan for anxiety. The patient does have significant anxiety in his life and we will encourage him to followup with primary care for long-term treatment of this.   The patient's blood pressure is elevated in the hospital and he says it is not like this in the doctor's office. He does not otherwise check his blood pressure. We will follow his blood pressure while he is  in the hospital and discuss with him whether or not blood pressure control medications are needed.   Melida Quitter, PA-C 03/09/2013 4:50 PM Beeper [phone]   Attending Note:   The patient was seen and examined.  Agree with assessment and plan as noted above.  Changes made to the above note as needed.  Pt is feeling better but was feeling fairly poorly this am.  I think our best options is to admit him for observation tonight and anticipate doing a stress myoview in the am.  If his enzymes are positive, will anticipate doing a cath.   Vesta Mixer, Montez Hageman., MD, [facility] 03/09/2013, 6:36 PM

## 2013-03-10 ENCOUNTER — Observation Stay (HOSPITAL_COMMUNITY): Payer: BC Managed Care – PPO

## 2013-03-10 ENCOUNTER — Encounter (HOSPITAL_COMMUNITY): Payer: Self-pay | Admitting: *Deleted

## 2013-03-10 DIAGNOSIS — K219 Gastro-esophageal reflux disease without esophagitis: Secondary | ICD-10-CM

## 2013-03-10 DIAGNOSIS — R072 Precordial pain: Principal | ICD-10-CM

## 2013-03-10 DIAGNOSIS — R079 Chest pain, unspecified: Secondary | ICD-10-CM

## 2013-03-10 LAB — COMPREHENSIVE METABOLIC PANEL
BUN: 17 mg/dL (ref 6–23)
Calcium: 8.9 mg/dL (ref 8.4–10.5)
GFR calc Af Amer: 89 mL/min — ABNORMAL LOW (ref >90)
Glucose, Bld: 94 mg/dL (ref 70–99)
Sodium: 137 mEq/L (ref 135–145)
Total Protein: 6.4 g/dL (ref 6.0–8.3)

## 2013-03-10 LAB — LIPID PANEL
Cholesterol: 168 mg/dL (ref 0–200)
HDL: 41 mg/dL (ref >39)
Total CHOL/HDL Ratio: 4.1 RATIO

## 2013-03-10 LAB — TSH: TSH: 1.896 u[IU]/mL (ref 0.350–4.500)

## 2013-03-10 MED ORDER — PANTOPRAZOLE SODIUM 40 MG PO TBEC: 40.0000 mg | DELAYED_RELEASE_TABLET | Freq: Every day | ORAL | Status: DC

## 2013-03-10 MED ORDER — TRAMADOL HCL 50 MG PO TABS: 50.0000 mg | ORAL_TABLET | Freq: Four times a day (QID) | ORAL | Status: DC | PRN

## 2013-03-10 MED ORDER — TECHNETIUM TC 99M SESTAMIBI GENERIC - CARDIOLITE
30.0000 | Freq: Once | INTRAVENOUS | Status: AC | PRN
  Administered 2013-03-10: 30 via INTRAVENOUS

## 2013-03-10 MED ORDER — TECHNETIUM TC 99M SESTAMIBI GENERIC - CARDIOLITE
10.0000 | Freq: Once | INTRAVENOUS | Status: AC | PRN
  Administered 2013-03-10: 10 via INTRAVENOUS

## 2013-03-10 MED ORDER — NICOTINE 14 MG/24HR TD PT24: 1.0000 | MEDICATED_PATCH | Freq: Every day | TRANSDERMAL | Status: DC

## 2013-03-10 NOTE — Progress Notes (Signed)
Patient returned from nuclear medicine for stress test very irritable and frustrated.  Wanting to leave Against Medical Advise.  Refused to put telemetry on and wanting IV out.  Dr. Tenny Craw and Ronie Spies PA paged and notified.  Patient agreable to stay until 4pm, without placing heart monitor back on.  Will continue to monitor.  Thomas Curtis

## 2013-03-10 NOTE — Discharge Summary (Signed)
CARDIOLOGY DISCHARGE SUMMARY   Patient ID: Thomas Curtis MRN: [id number] DOB/AGE: May 14, 1961 52 y.o.  Admit date: 03/09/2013 Discharge date: 03/10/2013  Primary Discharge Diagnosis:    Precordial pain Secondary Discharge Diagnosis:  Principal Problem:  Procedures: GXT Cardiolite  Hospital Course: Thomas Curtis is a 52 y.o. male with no history of CAD. He had prolonged chest pain he came to the hospital where he was admitted for further evaluation and treatment.  His cardiac enzymes were negative for him despite greater than 24 hours of chest pain. The patient was anchored initially by not being fed in the emergency room and by not being given a cause for his chest pain during previous evaluations. He is also concerned about finances and about home stressors. He said repeatedly that he has stressors both at home and at work and never gets a break.  He was started on a nicotine patch for smoking cessation and continued on his home dose of Zoloft. He had when necessary Ativan added while he is in the hospital for anxiety.   On 03/10/2013, Thomas Curtis had an exercise Cardiolite. Impression: Exercise Sestamibi: Clinically negative, electrically negative for ischemia Perfusion scan with probable normal perfusion, no ischemia or scar. LVEF on gating calculated 56%.  No further inpatient workup is indicated. Mr. Impression: Exercise Sestamibi: Clinically negative, electrically negative for ischemia Perfusion scan with probable normal perfusion, no ischemia or scar. LVEF on gating calculated 56%.  On 03/10/2013, Thomas Curtis's pain has improved on medical therapy. He was started on a nicotine patch, a PPI and Tramadol, with some improvement in his symptoms.  He was ambulating at baseline and considered stable for discharge, to followup as an outpatient with primary care and with cardiology as needed.  Labs:  Lab Results  Component Value Date   WBC 7.7 03/09/2013   HGB 15.4  03/09/2013   HCT 43.3 03/09/2013   MCV 88.7 03/09/2013   PLT 252 03/09/2013     Recent Labs Lab 03/10/13 0500  NA 137  K 4.3  CL 105  CO2 23  BUN 17  CREATININE 1.08  CALCIUM 8.9  PROT 6.4  BILITOT 0.5  ALKPHOS 85  ALT 15  AST 16  GLUCOSE 94    Recent Labs  03/09/13 2219 03/10/13 0500  TROPONINI <0.30 <0.30   Lipid Panel     Component Value Date/Time   CHOL 168 03/10/2013 0500   TRIG 77 03/10/2013 0500   HDL 41 03/10/2013 0500   CHOLHDL 4.1 03/10/2013 0500   VLDL 15 03/10/2013 0500   LDLCALC 112* 03/10/2013 0500    Pro B Natriuretic peptide (BNP)  Date/Time Value Range Status  03/09/2013 11:30 AM 94.9  0 - 125 pg/mL Final    Recent Labs  03/09/13 2219  INR 0.94      Radiology: Dg Chest 2 View 03/09/2013   *RADIOLOGY REPORT*  Clinical Data: Chest and left arm pain, smoking history  CHEST - 2 VIEW  Comparison: Chest x-ray of 01/20/2009  Findings: No active infiltrate or effusion is seen.  Mediastinal contours are stable.  The heart is within normal limits in size. No bony abnormality is noted.  Widening of the right ECA 2010 is noted.  IMPRESSION: Stable chest x-ray.  No active lung disease.   Original Report Authenticated By: Dwyane Dee, M.D.   EKG: SR  FOLLOW UP PLANS AND APPOINTMENTS No Known Allergies   Medication List    TAKE these medications  nicotine 14 mg/24hr patch  Commonly known as:  NICODERM CQ - dosed in mg/24 hours  Place 1 patch onto the skin daily. Complete 2 weeks at 14 mg then 12 weeks at 7 mg and stop.     pantoprazole 40 MG tablet  Commonly known as:  PROTONIX  Take 1 tablet (40 mg total) by mouth daily.     sertraline 50 MG tablet  Commonly known as:  ZOLOFT  Take 1 tablet (50 mg total) by mouth daily.     traMADol 50 MG tablet  Commonly known as:  ULTRAM  Take 1-2 tablets (50-100 mg total) by mouth every 6 (six) hours as needed.        Discharge Orders   Future Orders Complete By Expires     Diet - low sodium heart  healthy  As directed     Increase activity slowly  As directed       Follow-up Information   Schedule an appointment as soon as possible for a visit with Kristian Covey, MD.   Contact information:   [address] [place] [address] [phone]       Follow up with Elyn Aquas., MD. (As needed)    Contact information:   [address] [place] [address] [phone]       BRING ALL MEDICATIONS WITH YOU TO FOLLOW UP APPOINTMENTS  Time spent with patient to include physician time: 36 min Signed: Theodore Demark, PA-C 03/10/2013, 3:42 PM Co-Sign MD

## 2013-03-10 NOTE — Progress Notes (Signed)
Utilization review completed.  P.J. Afton Mikelson,RN,BSN Case Manager [phone]  

## 2013-03-10 NOTE — Progress Notes (Signed)
Patient Name: Thomas Curtis Date of Encounter: 03/10/2013  Principal Problem:   Precordial pain    SUBJECTIVE: Pt with continuous upper right chest pain since admission, between 1-3/10. Also currently having lumbar back pain, has had before. Chronic left buttock/hip pain. Has epigastric pain. That was better last pm after eating but got worse again today (has been NPO). He also reports water brash at times. He also complains of numbness left forearm (x 2 weeks) but has had upper arm numbness since admission.  OBJECTIVE Filed Vitals:   03/09/13 2030 03/09/13 2100 03/09/13 2154 03/10/13 0615  BP: 126/70 124/76 136/87 127/85  Pulse: 72 66 68 59  Temp:   98.5 F (36.9 C) 98.1 F (36.7 C)  TempSrc:   Oral Oral  Resp:  17 18 20   Height:   5\' 11"  (1.803 m)   Weight:   245 lb 4.8 oz (111.267 kg) 241 lb 14.4 oz (109.725 kg)  SpO2: 95% 96% 100% 97%    Intake/Output Summary (Last 24 hours) at 03/10/13 1040 Last data filed at 03/10/13 0757  Gross per 24 hour  Intake      0 ml  Output      0 ml  Net      0 ml   Filed Weights   03/09/13 2154 03/10/13 0615  Weight: 245 lb 4.8 oz (111.267 kg) 241 lb 14.4 oz (109.725 kg)    PHYSICAL EXAM General: Well developed, well nourished, male in no acute distress. Head: Normocephalic, atraumatic.  Neck: Supple without bruits, JVD not elevated. Lungs:  Resp regular and unlabored, CTA. Upper sternal area is tender to palpation Heart: RRR, S1, S2, no S3, S4, or murmur; no rub. Abdomen: Soft, non-tender, non-distended, BS + x 4.  Extremities: No clubbing, cyanosis, no edema. Minor right shoulder crepitus with movement, that area is tender to palplation Neuro: Alert and oriented X 3. Moves all extremities spontaneously. Psych: Irritated, angry at the situation  LABS: CBC: Recent Labs  03/09/13 1130  WBC 7.7  HGB 15.4  HCT 43.3  MCV 88.7  PLT 252   INR: Recent Labs  03/09/13 2219  INR 0.94   Basic Metabolic Panel: Recent  Labs  16/10/96 1130 03/10/13 0500  NA 138 137  K 4.1 4.3  CL 104 105  CO2 23 23  GLUCOSE 91 94  BUN 17 17  CREATININE 1.21 1.08  CALCIUM 9.5 8.9   Liver Function Tests: Recent Labs  03/09/13 1130 03/10/13 0500  AST 23 16  ALT 17 15  ALKPHOS 99 85  BILITOT 0.3 0.5  PROT 7.2 6.4  ALBUMIN 4.1 3.5   Cardiac Enzymes: Recent Labs  03/09/13 2219 03/10/13 0500  TROPONINI <0.30 <0.30    Recent Labs  03/09/13 1151 03/09/13 1432  TROPIPOC 0.00 0.00   BNP: Pro B Natriuretic peptide (BNP)  Date/Time Value Range Status  03/09/2013 11:30 AM 94.9  0 - 125 pg/mL Final   Fasting Lipid Panel: Recent Labs  03/10/13 0500  CHOL 168  HDL 41  LDLCALC 112*  TRIG 77  CHOLHDL 4.1    TELE:  SR     Radiology/Studies: Dg Chest 2 View 03/09/2013   *RADIOLOGY REPORT*  Clinical Data: Chest and left arm pain, smoking history  CHEST - 2 VIEW  Comparison: Chest x-ray of 01/20/2009  Findings: No active infiltrate or effusion is seen.  Mediastinal contours are stable.  The heart is within normal limits in size. No bony abnormality is noted.  Widening  of the right ECA 2010 is noted.  IMPRESSION: Stable chest x-ray.  No active lung disease.   Original Report Authenticated By: Dwyane Dee, M.D.     Current Medications:  . aspirin EC  81 mg Oral Daily  . enoxaparin (LOVENOX) injection  40 mg Subcutaneous Q24H  . LORazepam  1 mg Intravenous Once  . nicotine  14 mg Transdermal Daily  . pantoprazole  40 mg Oral Daily  . sertraline  50 mg Oral Daily  . sodium chloride  500 mL Intravenous Once  . sodium chloride  3 mL Intravenous Q12H      ASSESSMENT AND PLAN: Principal Problem:   Precordial pain - spent a prolonged period of time discussing situation with patient. Reviewed more serious causes of chest pain assessed by labs/Xray done so far: infection, MI, anemia, electrolyte abnormalities.   Reviewed his possible causes of chest pain: possible MS pain with chest wall tenderness and left  shoulder tenderness; GI possibly the cause with reflux symptoms  Pt had been very upset earlier in stress room after a staff member laughed at a comment she thought the patient meant as humorous. Staff member and others present reassured patient she was not laughing at him and he seemed to accept this.  Signed, Theodore Demark , PA-C 10:40 AM 03/10/2013  Addendum: GXT Cardiolite performed. During test, pt had problems with ambulation, changed to manual from Bruce protocol and pt able to reach target. He c/o extreme SOB, became light-headed at target but was able to keep HR up for 1 minute after CL injected. No obvious ischemic changes on ECG. Image pending.  [place]   Patient seen and examined.  Agree with findings as noted by R Barrett above.  I have amended note to reflect my findings  Myoview pending.

## 2013-03-10 NOTE — Progress Notes (Signed)
Reviewed discharge instructions with patient and wife, they stated their understanding.  Patient discharged home with wife.  Thomas Curtis Danielle  

## 2013-03-11 NOTE — ED Provider Notes (Signed)
History/physical exam/procedure(s) were performed by non-physician practitioner and as supervising physician I was immediately available for consultation/collaboration. I have reviewed all notes and am in agreement with care and plan.   Celso Granja S Daizee Firmin, MD 03/11/13 1245 

## 2013-03-30 ENCOUNTER — Encounter: Payer: Self-pay | Admitting: Family Medicine

## 2013-03-30 ENCOUNTER — Ambulatory Visit (INDEPENDENT_AMBULATORY_CARE_PROVIDER_SITE_OTHER): Payer: BC Managed Care – PPO | Admitting: Family Medicine

## 2013-03-30 VITALS — BP 124/82 | HR 80 | Temp 98.6°F | Wt 242.0 lb

## 2013-03-30 DIAGNOSIS — K219 Gastro-esophageal reflux disease without esophagitis: Secondary | ICD-10-CM

## 2013-03-30 DIAGNOSIS — R0789 Other chest pain: Secondary | ICD-10-CM

## 2013-03-30 DIAGNOSIS — F411 Generalized anxiety disorder: Secondary | ICD-10-CM

## 2013-03-30 DIAGNOSIS — F419 Anxiety disorder, unspecified: Secondary | ICD-10-CM

## 2013-03-30 MED ORDER — LORAZEPAM 1 MG PO TABS
1.0000 mg | ORAL_TABLET | Freq: Four times a day (QID) | ORAL | Status: DC | PRN
Start: 2013-03-30 — End: 2015-04-12

## 2013-03-30 MED ORDER — SERTRALINE HCL 100 MG PO TABS: 100.0000 mg | ORAL_TABLET | Freq: Every day | ORAL | Status: DC

## 2013-03-30 NOTE — Progress Notes (Signed)
  Subjective:    Patient ID: Thomas Curtis, male    DOB: [birth year], 52 y.o.   MRN: [id number]  HPI Patient seen for recent hospital admission He presented on 6-26 with somewhat atypical chest pain. He had prolonged chest pain for 24 hours. Exercise Cardiolite was unremarkable. Enzymes were negative. Chest x-ray no acute findings. Patient was prescribed nicotine patch which has not remained on.  Hiistory of GERD and has been maintained on protonic 40 mg daily. His pain was substernal and relatively prolonged. Still some discomfort and this has never been exertional. No odynophagia. No dysphagia. No appetite or weight changes.  Does relate significant anxiety and stress related to work and financial issues. Several children still living at home. Currently takes sertraline 50 mg once daily. Saw great improvement after starting this. No alcohol use. Still smoking as above.  Past Medical History  Diagnosis Date  . Chicken pox   . GERD (gastroesophageal reflux disease)   . Colon polyps 2010  . Anxiety   . Anginal pain   . Depression    Past Surgical History  Procedure Laterality Date  . None      reports that he has been smoking Cigarettes.  He has a 35 pack-year smoking history. He does not have any smokeless tobacco history on file. He reports that  drinks alcohol. He reports that he does not use illicit drugs. family history includes Cancer in his father and Hypertension in his father and mother. No Known Allergies    Review of Systems  Constitutional: Negative for fever, appetite change and unexpected weight change.  Respiratory: Negative for cough, choking and shortness of breath.   Cardiovascular: Positive for chest pain. Negative for palpitations and leg swelling.  Gastrointestinal: Negative for abdominal pain and blood in stool.       Objective:   Physical Exam  Constitutional: He is oriented to person, place, and time. He appears well-developed and well-nourished.   Neck: Neck supple. No thyromegaly present.  Cardiovascular: Normal rate and regular rhythm.   Pulmonary/Chest: Effort normal and breath sounds normal. No respiratory distress. He has no wheezes. He has no rales.  Abdominal: Soft. He exhibits no distension and no mass. There is no tenderness. There is no rebound and no guarding.  Neurological: He is alert and oriented to person, place, and time.  Psychiatric: Judgment and thought content normal.  Slightly anxious          Assessment & Plan:  Atypical chest pain. Recent nuclear stress test unremarkable. Encouraged to stop smoking. Suspect some of this is stress and anxiety related. Titrate sertraline 100 mg daily. Limited lorazepam one every 6-8 hours for severe anxiety symptoms. We discussed referral for possible EGD/further GI evaluation and this point he is unwilling because of cost issues. Continue protonix.  He does not have any red flags such as dysphagia, appetite or weight change.

## 2014-02-14 ENCOUNTER — Encounter: Payer: Self-pay | Admitting: Family Medicine

## 2014-02-14 ENCOUNTER — Telehealth: Payer: Self-pay | Admitting: Family Medicine

## 2014-02-14 ENCOUNTER — Ambulatory Visit (INDEPENDENT_AMBULATORY_CARE_PROVIDER_SITE_OTHER): Payer: BC Managed Care – PPO | Admitting: Family Medicine

## 2014-02-14 VITALS — BP 140/78 | HR 72 | Temp 98.0°F | Wt 252.0 lb

## 2014-02-14 DIAGNOSIS — F411 Generalized anxiety disorder: Secondary | ICD-10-CM

## 2014-02-14 DIAGNOSIS — F419 Anxiety disorder, unspecified: Secondary | ICD-10-CM

## 2014-02-14 DIAGNOSIS — M545 Low back pain, unspecified: Secondary | ICD-10-CM

## 2014-02-14 MED ORDER — CYCLOBENZAPRINE HCL 10 MG PO TABS: 10.0000 mg | ORAL_TABLET | Freq: Three times a day (TID) | ORAL | Status: DC | PRN

## 2014-02-14 MED ORDER — DICLOFENAC SODIUM 75 MG PO TBEC: 75.0000 mg | DELAYED_RELEASE_TABLET | Freq: Two times a day (BID) | ORAL | Status: DC

## 2014-02-14 NOTE — Progress Notes (Signed)
Pre visit review using our clinic review tool, if applicable. No additional management support is needed unless otherwise documented below in the visit note. 

## 2014-02-14 NOTE — Progress Notes (Signed)
   Subjective:    Patient ID: Thomas Curtis, male    DOB: [birth year], 53 y.o.   MRN: [id number]  Back Pain Pertinent negatives include no abdominal pain, chest pain, dysuria, fever, numbness or weakness.   Back pain. Patient's had back pain off and on for many years. He saw a neurosurgeon and had MRI way back in 2002 which had degenerative changes at that point. His current pain is lumbar region right side. Onset about 2 months ago. No recent injury. Occasional radiation toward his right thigh. Pain is worse with walking but sometimes at rest. Sharp quality. 10 out 10 severity at its worst. Occasional numbness right lateral 5. No urine or stool incontinence. No definite weakness. He took some type of anti-inflammatory medication that his wife had and that seemed to help somewhat. He also took a Flexeril 10 mg which he thinks may have helped slightly.  His job requires lots of lifting. He is on any recent appetite or weight changes. No constitutional symptoms such as fever or chills. No dysuria. No flank pain. Pain is exacerbated by flexing and lifting  Also complains of difficulties with some chronic anxiety and low frustration tolerance. His bills sertraline previously but feels this was not helping much. We have suggested counseling in the past  Past Medical History  Diagnosis Date  . Chicken pox   . GERD (gastroesophageal reflux disease)   . Colon polyps 2010  . Anxiety   . Anginal pain   . Depression    Past Surgical History  Procedure Laterality Date  . None      reports that he has been smoking Cigarettes.  He has a 35 pack-year smoking history. He does not have any smokeless tobacco history on file. He reports that he drinks alcohol. He reports that he does not use illicit drugs. family history includes Cancer in his father; Hypertension in his father and mother. No Known Allergies    Review of Systems  Constitutional: Negative for fever, activity change, appetite change  and unexpected weight change.  Respiratory: Negative for cough and shortness of breath.   Cardiovascular: Negative for chest pain and leg swelling.  Gastrointestinal: Negative for vomiting and abdominal pain.  Genitourinary: Negative for dysuria, hematuria and flank pain.  Musculoskeletal: Positive for back pain. Negative for joint swelling.  Neurological: Negative for weakness and numbness.       Objective:   Physical Exam  Constitutional: He appears well-developed and well-nourished.  Cardiovascular: Normal rate and regular rhythm.  Exam reveals no gallop.   Pulmonary/Chest: Effort normal and breath sounds normal. No respiratory distress. He has no wheezes. He has no rales.  Musculoskeletal: He exhibits no edema.  Straight leg raises produce back pain but no right lower extremity pain.  Neurological:  Full-strength lower extremities. 2+ reflexes knee and ankle bilaterally. Normal sensory function to touch. Gait is normal. Pain with back flexion or extension          Assessment & Plan:  Lumbar back pain. No recent injury. Nonfocal neurologic exam as above. Diclofenac 75 mg twice a day with food. Flexeril 10 mg every 8 hours when necessary and cautioned about possible sedation. Try heat or ice for symptom relief. Suggested some simple extension stretches. Consider physical therapy if no better in 2 weeks.   Chronic anxiety. We have discussed possible counseling.

## 2014-02-14 NOTE — Telephone Encounter (Signed)
Relevant patient education mailed to patient.  

## 2014-02-14 NOTE — Patient Instructions (Signed)
Lumbosacral Strain Lumbosacral strain is a strain of any of the parts that make up your lumbosacral vertebrae. Your lumbosacral vertebrae are the bones that make up the lower third of your backbone. Your lumbosacral vertebrae are held together by muscles and tough, fibrous tissue (ligaments).  CAUSES  A sudden blow to your back can cause lumbosacral strain. Also, anything that causes an excessive stretch of the muscles in the low back can cause this strain. This is typically seen when people exert themselves strenuously, fall, lift heavy objects, bend, or crouch repeatedly. RISK FACTORS  Physically demanding work.  Participation in pushing or pulling sports or sports that require sudden twist of the back (tennis, golf, baseball).  Weight lifting.  Excessive lower back curvature.  Forward-tilted pelvis.  Weak back or abdominal muscles or both.  Tight hamstrings. SIGNS AND SYMPTOMS  Lumbosacral strain may cause pain in the area of your injury or pain that moves (radiates) down your leg.  DIAGNOSIS Your health care provider can often diagnose lumbosacral strain through a physical exam. In some cases, you may need tests such as X-ray exams.  TREATMENT  Treatment for your lower back injury depends on many factors that your clinician will have to evaluate. However, most treatment will include the use of anti-inflammatory medicines. HOME CARE INSTRUCTIONS   Avoid hard physical activities (tennis, racquetball, waterskiing) if you are not in proper physical condition for it. This may aggravate or create problems.  If you have a back problem, avoid sports requiring sudden body movements. Swimming and walking are generally safer activities.  Maintain good posture.  Maintain a healthy weight.  For acute conditions, you may put ice on the injured area.  Put ice in a plastic bag.  Place a towel between your skin and the bag.  Leave the ice on for 20 minutes, 2 3 times a day.  When the  low back starts healing, stretching and strengthening exercises may be recommended. SEEK MEDICAL CARE IF:  Your back pain is getting worse.  You experience severe back pain not relieved with medicines. SEEK IMMEDIATE MEDICAL CARE IF:   You have numbness, tingling, weakness, or problems with the use of your arms or legs.  There is a change in bowel or bladder control.  You have increasing pain in any area of the body, including your belly (abdomen).  You notice shortness of breath, dizziness, or feel faint.  You feel sick to your stomach (nauseous), are throwing up (vomiting), or become sweaty.  You notice discoloration of your toes or legs, or your feet get very cold. MAKE SURE YOU:   Understand these instructions.  Will watch your condition.  Will get help right away if you are not doing well or get worse. Document Released: 06/10/2005 Document Revised: 06/21/2013 Document Reviewed: 04/19/2013 [employer] Patient Information 2014 [employer].  

## 2014-02-22 ENCOUNTER — Telehealth: Payer: Self-pay | Admitting: Family Medicine

## 2014-02-22 NOTE — Telephone Encounter (Signed)
Pt was seen on 01/14/14 regarding back pain pt states he is still in a lot of pain and the meds is not working, need to know what his options are and if there is something else he can take. Requesting a call back from dr. Elease Hashimoto.

## 2014-02-23 ENCOUNTER — Other Ambulatory Visit: Payer: Self-pay | Admitting: Family Medicine

## 2014-02-23 DIAGNOSIS — M549 Dorsalgia, unspecified: Secondary | ICD-10-CM

## 2014-02-23 MED ORDER — TRAMADOL HCL 50 MG PO TABS: ORAL_TABLET | ORAL | Status: DC

## 2014-02-23 NOTE — Telephone Encounter (Signed)
I recommend setting up physical therapy.  May also call in Tramadol 50 mg 1-2 po q 6 hours prn pain #60 with one refill.

## 2014-02-23 NOTE — Telephone Encounter (Signed)
Pt informed. Rx sent to pharmacy and PT referral is ordered.

## 2014-02-23 NOTE — Telephone Encounter (Signed)
Left message for patient to return call.

## 2014-03-18 ENCOUNTER — Other Ambulatory Visit: Payer: Self-pay | Admitting: Family Medicine

## 2014-03-27 ENCOUNTER — Ambulatory Visit (INDEPENDENT_AMBULATORY_CARE_PROVIDER_SITE_OTHER): Payer: BC Managed Care – PPO | Admitting: Family Medicine

## 2014-03-27 ENCOUNTER — Encounter: Payer: Self-pay | Admitting: Family Medicine

## 2014-03-27 VITALS — BP 130/80 | HR 65 | Temp 98.4°F | Wt 249.0 lb

## 2014-03-27 DIAGNOSIS — R200 Anesthesia of skin: Secondary | ICD-10-CM

## 2014-03-27 DIAGNOSIS — R209 Unspecified disturbances of skin sensation: Secondary | ICD-10-CM

## 2014-03-27 MED ORDER — PREDNISONE 10 MG PO TABS: ORAL_TABLET | ORAL | Status: DC

## 2014-03-27 MED ORDER — PANTOPRAZOLE SODIUM 40 MG PO TBEC: 40.0000 mg | DELAYED_RELEASE_TABLET | Freq: Every day | ORAL | Status: DC

## 2014-03-27 NOTE — Progress Notes (Signed)
Pre visit review using our clinic review tool, if applicable. No additional management support is needed unless otherwise documented below in the visit note. 

## 2014-03-27 NOTE — Progress Notes (Signed)
   Subjective:    Patient ID: Thomas Curtis, male    DOB: 01/03/1961, 53 y.o.   MRN: [id number]  HPI  Patient seen with left arm numbness. He states "whole arm" is numb.  Onset about 3 weeks ago. No injury. Some left-sided neck pain. Constant night or day. Somewhat intermittent. Possibly some mild loss of strength with grip intermittently. He has history of severe arthritis cervical spine going back to x-rays 10 years ago which were reviewed. He's also had problems with his lumbar spine which is currently stable. No recent injury. Denies any left lower extremity numbness or weakness. No other focal neurologic symptoms.  Past Medical History  Diagnosis Date  . Chicken pox   . GERD (gastroesophageal reflux disease)   . Colon polyps 2010  . Anxiety   . Anginal pain   . Depression    Past Surgical History  Procedure Laterality Date  . None      reports that he has been smoking Cigarettes.  He has a 35 pack-year smoking history. He does not have any smokeless tobacco history on file. He reports that he drinks alcohol. He reports that he does not use illicit drugs. family history includes Cancer in his father; Hypertension in his father and mother. No Known Allergies    Review of Systems  Constitutional: Negative for fever, appetite change and fatigue.  HENT: Negative for ear pain and trouble swallowing.   Respiratory: Negative for cough and shortness of breath.   Cardiovascular: Negative for chest pain and palpitations.  Musculoskeletal: Negative for joint swelling.  Skin: Negative for rash.  Neurological: Negative for headaches.  Hematological: Negative for adenopathy.       Objective:   Physical Exam  Constitutional: He appears well-developed and well-nourished.  Cardiovascular: Normal rate and regular rhythm.   Pulmonary/Chest: Effort normal and breath sounds normal. No respiratory distress. He has no wheezes. He has no rales.  Neurological:  Full-strength upper  extremities. Normal sensory function to touch. 2+ reflexes brachioradialis and biceps          Assessment & Plan:  Left upper extremity numbness. Suspect cervical nerve root impingement. He's not had any focal weakness. Try prednisone taper. Touch base one to 2 weeks if not improving

## 2014-03-27 NOTE — Patient Instructions (Signed)
Let me know in 1-2 weeks if numbness not any better We will need to consider neck films if not improving.

## 2014-04-16 ENCOUNTER — Encounter: Payer: Self-pay | Admitting: Physician Assistant

## 2014-04-16 ENCOUNTER — Ambulatory Visit (INDEPENDENT_AMBULATORY_CARE_PROVIDER_SITE_OTHER): Payer: BC Managed Care – PPO | Admitting: Physician Assistant

## 2014-04-16 VITALS — BP 130/78 | HR 91 | Temp 98.6°F | Resp 18 | Wt 248.0 lb

## 2014-04-16 DIAGNOSIS — L255 Unspecified contact dermatitis due to plants, except food: Secondary | ICD-10-CM

## 2014-04-16 DIAGNOSIS — L237 Allergic contact dermatitis due to plants, except food: Secondary | ICD-10-CM

## 2014-04-16 MED ORDER — PREDNISONE 10 MG PO TABS: ORAL_TABLET | ORAL | Status: DC

## 2014-04-16 NOTE — Progress Notes (Signed)
Pre visit review using our clinic review tool, if applicable. No additional management support is needed unless otherwise documented below in the visit note. 

## 2014-04-16 NOTE — Patient Instructions (Addendum)
Prednisone Taper as directed. Take with food to avoid nausea.  Continue topical relief creams and cool compresses.  If emergency symptoms discussed during visit developed, seek medical attention immediately.  Followup as needed, or for worsening or persistent symptoms despite treatment.   Poison [employer] ivy is a rash caused by touching the leaves of the poison ivy plant. The rash often shows up 48 hours later. You might just have bumps, redness, and itching. Sometimes, blisters appear and break open. Your eyes may get puffy (swollen). Poison ivy often heals in 2 to 3 weeks without treatment. HOME CARE  If you touch poison ivy:  Wash your skin with soap and water right away. Wash under your fingernails. Do not rub the skin very hard.  Wash any clothes you were wearing.  Avoid poison ivy in the future. Poison ivy has 3 leaves on a stem.  Use medicine to help with itching as told by your doctor. Do not drive when you take this medicine.  Keep open sores dry, clean, and covered with a bandage and medicated cream, if needed.  Ask your doctor about medicine for children. GET HELP RIGHT AWAY IF:  You have open sores.  Redness spreads beyond the area of the rash.  There is yellowish white fluid (pus) coming from the rash.  Pain gets worse.  You have a temperature by mouth above 102 F (38.9 C), not controlled by medicine. MAKE SURE YOU:  Understand these instructions.  Will watch your condition.  Will get help right away if you are not doing well or get worse. Document Released: 10/03/2010 Document Revised: 11/23/2011 Document Reviewed: 10/03/2010 [facility] Patient Information 2015 [place], [place]. This information is not intended to replace advice given to you by your health care provider. Make sure you discuss any questions you have with your health care provider.

## 2014-04-16 NOTE — Progress Notes (Signed)
Subjective:    Patient ID: Thomas Curtis, male    DOB: [birth year], 53 y.o.   MRN: [id number]  Thomas Curtis This is a new problem. The current episode started in the past 7 days. The problem has been rapidly worsening since onset. The rash is diffuse. The rash is characterized by blistering, redness and itchiness. 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 Pertinent negatives include no anorexia, congestion, cough, diarrhea, eye pain, facial edema, fatigue, fever, joint pain, nail changes, rhinorrhea, shortness of breath, sore throat or vomiting. Treatments tried: calamine. The treatment provided mild relief.      Review of Systems  Constitutional: Negative for fever, chills and fatigue.  HENT: Negative for congestion, rhinorrhea and sore throat.   Eyes: Negative for pain.  Respiratory: Negative for cough and shortness of breath.   Cardiovascular: Negative for chest pain.  Gastrointestinal: Negative for nausea, vomiting, abdominal pain, diarrhea and anorexia.  Musculoskeletal: Negative for joint pain.  Skin: Positive for rash. Negative for nail changes.  Neurological: Negative for syncope and headaches.  All other systems reviewed and are negative.    Past Medical History  Diagnosis Date  . Chicken pox   . GERD (gastroesophageal reflux disease)   . Colon polyps 2010  . Anxiety   . Anginal pain   . Depression     History   Social History  . Marital Status: Single    Spouse Name: N/A    Number of Children: N/A  . Years of Education: N/A   Occupational History  . 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 Smoking status: Current Every Day Smoker -- 1.00 packs/day for 35 years    Types: Cigarettes  . Smokeless tobacco: Not on file  . Alcohol Use: Yes     Comment: Does no abuse  . Drug Use: No  . Sexual Activity: Not on file   Other Topics Concern  . Not on file   Social History Narrative  . No narrative on file    Past Surgical History    Procedure Laterality Date  . None      Family History  Problem Relation Age of Onset  . Hypertension Mother   . Hypertension Father   . Cancer Father     colon, lung, prostate    No Known Allergies  Current Outpatient Prescriptions on File Prior to Visit  Medication Sig Dispense Refill  . LORazepam (ATIVAN) 1 MG tablet Take 1 tablet (1 mg total) by mouth every 6 (six) hours as needed for anxiety.  30 tablet  0  . pantoprazole (PROTONIX) 40 MG tablet Take 1 tablet (40 mg total) by mouth daily.  30 tablet  5   No current facility-administered medications on file prior to visit.    EXAM: BP 130/78  Pulse 91  Temp(Src) 98.6 F (37 C) (Oral)  Resp 18  Wt 248 lb (112.492 kg)  SpO2 98%     Objective:   Physical Exam  Nursing note and vitals reviewed. Constitutional: He is oriented to person, place, and time. He appears well-developed and well-nourished. No distress.  HENT:  Head: Normocephalic and atraumatic.  Eyes: Conjunctivae and EOM are normal. Pupils are equal, round, and reactive to light.  Cardiovascular: Normal rate, regular rhythm and intact distal pulses.   Pulmonary/Chest: Effort normal and breath sounds normal. No respiratory distress. He exhibits no tenderness.  Neurological: He is alert and oriented to person, place, and time.  Skin: Skin is  warm and dry. Rash noted. He is not diaphoretic. No erythema. No pallor.  Diffuse blistering erythematous rash covering bilateral arms bilateral legs neck. Covered in calamine lotion. No fluctuance or ttp.   Psychiatric: He has a normal mood and affect. His behavior is normal. Judgment and thought content normal.    Lab Results  Component Value Date   WBC 7.7 03/09/2013   HGB 15.4 03/09/2013   HCT 43.3 03/09/2013   PLT 252 03/09/2013   GLUCOSE 94 03/10/2013   CHOL 168 03/10/2013   TRIG 77 03/10/2013   HDL 41 03/10/2013   LDLDIRECT 141.1 11/26/2011   LDLCALC 112* 03/10/2013   ALT 15 03/10/2013   AST 16 03/10/2013   NA 137  03/10/2013   K 4.3 03/10/2013   CL 105 03/10/2013   CREATININE 1.08 03/10/2013   BUN 17 03/10/2013   CO2 23 03/10/2013   TSH 1.896 03/09/2013   PSA 0.39 11/26/2011   INR 0.94 03/09/2013         Assessment & Plan:  [name] was seen today for Thomas ivy.  Diagnoses and associated orders for this visit:  Thomas ivy dermatitis Comments: Diffuse, effecting most of skin. Will rx extended prednisone taper.  - predniSONE (DELTASONE) 10 MG tablet; 3 tablets twice daily for 4 days, then 2 tablets twice daily for 4 days,  then 1 tablet twice daily for 4 days, then one tablet daily  for 6 days    Continue topical itch relief and cool compresses.  Return precautions provided, and patient handout on Thomas ivy.  Plan to follow up as needed, or for worsening or persistent symptoms despite treatment.  Patient Instructions  Prednisone Taper as directed. Take with food to avoid nausea.  Continue topical relief creams and cool compresses.  If emergency symptoms discussed during visit developed, seek medical attention immediately.  Followup as needed, or for worsening or persistent symptoms despite treatment.

## 2015-04-12 ENCOUNTER — Emergency Department (HOSPITAL_COMMUNITY)
Admission: EM | Admit: 2015-04-12 | Discharge: 2015-04-12 | Disposition: A | Discharge status: Home / Self Care | Payer: BC Managed Care – PPO | Attending: Emergency Medicine | Admitting: Emergency Medicine

## 2015-04-12 ENCOUNTER — Encounter (HOSPITAL_COMMUNITY): Payer: Self-pay

## 2015-04-12 ENCOUNTER — Emergency Department (HOSPITAL_COMMUNITY): Payer: BC Managed Care – PPO

## 2015-04-12 DIAGNOSIS — Z720 Tobacco use: Secondary | ICD-10-CM | POA: Diagnosis not present

## 2015-04-12 DIAGNOSIS — R6883 Chills (without fever): Secondary | ICD-10-CM | POA: Insufficient documentation

## 2015-04-12 DIAGNOSIS — R112 Nausea with vomiting, unspecified: Secondary | ICD-10-CM

## 2015-04-12 DIAGNOSIS — Z8679 Personal history of other diseases of the circulatory system: Secondary | ICD-10-CM | POA: Insufficient documentation

## 2015-04-12 DIAGNOSIS — R7989 Other specified abnormal findings of blood chemistry: Secondary | ICD-10-CM

## 2015-04-12 DIAGNOSIS — R0602 Shortness of breath: Secondary | ICD-10-CM | POA: Insufficient documentation

## 2015-04-12 DIAGNOSIS — Z79899 Other long term (current) drug therapy: Secondary | ICD-10-CM | POA: Diagnosis not present

## 2015-04-12 DIAGNOSIS — K219 Gastro-esophageal reflux disease without esophagitis: Secondary | ICD-10-CM | POA: Insufficient documentation

## 2015-04-12 DIAGNOSIS — Z7952 Long term (current) use of systemic steroids: Secondary | ICD-10-CM | POA: Diagnosis not present

## 2015-04-12 DIAGNOSIS — R197 Diarrhea, unspecified: Secondary | ICD-10-CM | POA: Diagnosis not present

## 2015-04-12 DIAGNOSIS — R61 Generalized hyperhidrosis: Secondary | ICD-10-CM | POA: Diagnosis not present

## 2015-04-12 DIAGNOSIS — R0789 Other chest pain: Secondary | ICD-10-CM | POA: Diagnosis not present

## 2015-04-12 DIAGNOSIS — Z86010 Personal history of colonic polyps: Secondary | ICD-10-CM | POA: Insufficient documentation

## 2015-04-12 DIAGNOSIS — Z8619 Personal history of other infectious and parasitic diseases: Secondary | ICD-10-CM | POA: Insufficient documentation

## 2015-04-12 DIAGNOSIS — F419 Anxiety disorder, unspecified: Secondary | ICD-10-CM | POA: Diagnosis not present

## 2015-04-12 DIAGNOSIS — R079 Chest pain, unspecified: Secondary | ICD-10-CM | POA: Diagnosis present

## 2015-04-12 LAB — CBC
HCT: 44.3 % (ref 39.0–52.0)
Hemoglobin: 15.3 g/dL (ref 13.0–17.0)
MCH: 31.2 pg (ref 26.0–34.0)
MCHC: 34.5 g/dL (ref 30.0–36.0)
MCV: 90.4 fL (ref 78.0–100.0)
Platelets: 263 10*3/uL (ref 150–400)
RBC: 4.9 MIL/uL (ref 4.22–5.81)
RDW: 13.4 % (ref 11.5–15.5)
WBC: 7.5 10*3/uL (ref 4.0–10.5)

## 2015-04-12 LAB — BASIC METABOLIC PANEL
ANION GAP: 10 (ref 5–15)
BUN: 14 mg/dL (ref 6–20)
CO2: 21 mmol/L — ABNORMAL LOW (ref 22–32)
Calcium: 9.3 mg/dL (ref 8.9–10.3)
Chloride: 106 mmol/L (ref 101–111)
Creatinine, Ser: 1.31 mg/dL — ABNORMAL HIGH (ref 0.61–1.24)
GFR calc Af Amer: >60 mL/min (ref >60)
GFR calc non Af Amer: 60 mL/min — ABNORMAL LOW (ref >60)
Glucose, Bld: 114 mg/dL — ABNORMAL HIGH (ref 65–99)
Potassium: 4.3 mmol/L (ref 3.5–5.1)
Sodium: 137 mmol/L (ref 135–145)

## 2015-04-12 LAB — I-STAT TROPONIN, ED: TROPONIN I, POC: 0 ng/mL (ref 0.00–0.08)

## 2015-04-12 LAB — TROPONIN I

## 2015-04-12 MED ORDER — ONDANSETRON HCL 4 MG/2ML IJ SOLN
4.0000 mg | Freq: Once | INTRAMUSCULAR | Status: AC
  Administered 2015-04-12: 4 mg via INTRAVENOUS
  Filled 2015-04-12: qty 2

## 2015-04-12 MED ORDER — ONDANSETRON HCL 4 MG PO TABS: 4.0000 mg | ORAL_TABLET | Freq: Four times a day (QID) | ORAL | Status: DC | PRN

## 2015-04-12 MED ORDER — KETOROLAC TROMETHAMINE 30 MG/ML IJ SOLN
30.0000 mg | Freq: Once | INTRAMUSCULAR | Status: AC
  Administered 2015-04-12: 30 mg via INTRAVENOUS
  Filled 2015-04-12: qty 1

## 2015-04-12 MED ORDER — SODIUM CHLORIDE 0.9 % IV SOLN
Freq: Once | INTRAVENOUS | Status: AC
  Administered 2015-04-12: 07:00:00 via INTRAVENOUS

## 2015-04-12 MED ORDER — SODIUM CHLORIDE 0.9 % IV BOLUS (SEPSIS): 1000.0000 mL | Freq: Once | INTRAVENOUS | Status: DC

## 2015-04-12 MED ORDER — IPRATROPIUM-ALBUTEROL 0.5-2.5 (3) MG/3ML IN SOLN
3.0000 mL | Freq: Once | RESPIRATORY_TRACT | Status: AC
  Administered 2015-04-12: 3 mL via RESPIRATORY_TRACT
  Filled 2015-04-12: qty 3

## 2015-04-12 NOTE — ED Provider Notes (Signed)
CSN: [id number]     Arrival date & time 04/12/15  0520 History   First MD Initiated Contact with Patient 04/12/15 [phone]     Chief Complaint  Patient presents with  . Shortness of Breath  . Chest Pain     (Consider location/radiation/quality/duration/timing/severity/associated sxs/prior Treatment) Patient is a 54 y.o. male presenting with shortness of breath and chest pain. The history is provided by the patient.  Shortness of Breath Associated symptoms: chest pain   Chest Pain Associated symptoms: shortness of breath   He woke up this morning with nausea, vomiting, diarrhea, chest pain, dyspnea. He describes a pressure feeling in his chest. He has had 4 episodes of emesis and diarrhea. He has had chills and sweats but no fever. He has had no sick contacts. He rates his pain at 5/10. He is continuing to have nausea but he no longer feels is going to have diarrhea. His cardiac risk factors are significant for tobacco abuse. No history of hypertension, diabetes, hyperlipidemia.  Past Medical History  Diagnosis Date  . Chicken pox   . GERD (gastroesophageal reflux disease)   . Colon polyps 2010  . Anxiety   . Anginal pain   . Depression    Past Surgical History  Procedure Laterality Date  . None     Family History  Problem Relation Age of Onset  . Hypertension Mother   . Hypertension Father   . Cancer Father     colon, lung, prostate   History  Substance Use Topics  . Smoking status: Current Every Day Smoker -- 1.00 packs/day for 35 years    Types: Cigarettes  . Smokeless tobacco: Not on file  . Alcohol Use: Yes     Comment: Does no abuse    Review of Systems  Respiratory: Positive for shortness of breath.   Cardiovascular: Positive for chest pain.  All other systems reviewed and are negative.     Allergies  Review of patient's allergies indicates no known allergies.  Home Medications   Prior to Admission medications   Medication Sig Start Date End Date Taking?  Authorizing Provider  LORazepam (ATIVAN) 1 MG tablet Take 1 tablet (1 mg total) by mouth every 6 (six) hours as needed for anxiety. 03/30/13   Eulas Post, MD  pantoprazole (PROTONIX) 40 MG tablet Take 1 tablet (40 mg total) by mouth daily. 03/27/14 04/13/15  Eulas Post, MD  predniSONE (DELTASONE) 10 MG tablet 3 tablets twice daily for 4 days, then 2 tablets twice daily for 4 days,  then 1 tablet twice daily for 4 days, then one tablet daily  for 6 days 04/16/14   Zenaida Niece, PA-C   BP 143/85 mmHg  Pulse 77  Temp(Src) 97.9 F (36.6 C)  Resp 20  Ht 6' (1.829 m)  Wt 232 lb (105.235 kg)  BMI 31.46 kg/m2  SpO2 97% Physical Exam  Nursing note and vitals reviewed.  54 year old male, resting comfortably and in no acute distress. Vital signs are significant for borderline hypertension. Oxygen saturation is 97%, which is normal. Head is normocephalic and atraumatic. PERRLA, EOMI. Oropharynx is clear. Neck is nontender and supple without adenopathy or JVD. Back is nontender and there is no CVA tenderness. Lungs are clear without rales, wheezes, or rhonchi. Chest is nontender. Heart has regular rate and rhythm without murmur. Abdomen is soft, flat, nontender without masses or hepatosplenomegaly and peristalsis is hypoactive. Extremities have no cyanosis or edema, full range of motion is present.  Skin is warm and dry without rash. Neurologic: Mental status is normal, cranial nerves are intact, there are no motor or sensory deficits.  ED Course  Procedures (including critical care time) Labs Review Results for orders placed or performed during the hospital encounter of 29/47/65  Basic metabolic panel  Result Value Ref Range   Sodium 137 135 - 145 mmol/L   Potassium 4.3 3.5 - 5.1 mmol/L   Chloride 106 101 - 111 mmol/L   CO2 21 (L) 22 - 32 mmol/L   Glucose, Bld 114 (H) 65 - 99 mg/dL   BUN 14 6 - 20 mg/dL   Creatinine, Ser 1.31 (H) 0.61 - 1.24 mg/dL   Calcium 9.3 8.9 - 10.3  mg/dL   GFR calc non Af Amer 60 (L) >60 mL/min   GFR calc Af Amer >60 >60 mL/min   Anion gap 10 5 - 15  CBC  Result Value Ref Range   WBC 7.5 4.0 - 10.5 K/uL   RBC 4.90 4.22 - 5.81 MIL/uL   Hemoglobin 15.3 13.0 - 17.0 g/dL   HCT 44.3 39.0 - 52.0 %   MCV 90.4 78.0 - 100.0 fL   MCH 31.2 26.0 - 34.0 pg   MCHC 34.5 30.0 - 36.0 g/dL   RDW 13.4 11.5 - 15.5 %   Platelets 263 150 - 400 K/uL  Troponin I  Result Value Ref Range   Troponin I <0.03 <0.031 ng/mL  I-stat troponin, ED  Result Value Ref Range   Troponin i, poc 0.00 0.00 - 0.08 ng/mL   Comment 3           Imaging Review Dg Chest 2 View  04/12/2015   CLINICAL DATA:  Chest pain shortness since last night.  EXAM: CHEST  2 VIEW  COMPARISON:  03/09/2013  FINDINGS: The cardiomediastinal contours are normal. Mild hyperinflation and bronchial thickening. Pulmonary vasculature is normal. No consolidation, pleural effusion, or pneumothorax. No acute osseous abnormalities are seen. Unchanged widening of the right acromioclavicular joint.  IMPRESSION: Mild hyperinflation bronchial thickening, consistent with bronchitis, acute or chronic. No focal abnormality.   Electronically Signed   By: Jeb Levering M.D.   On: 04/12/2015 06:38     EKG Interpretation   Date/Time:  Friday April 12 2015 05:39:00 EDT Ventricular Rate:  81 PR Interval:  158 QRS Duration: 98 QT Interval:  384 QTC Calculation: 446 R Axis:   56 Text Interpretation:  Normal sinus rhythm RSR' or QR pattern in V1  suggests right ventricular conduction delay Borderline ECG When compared  with ECG of 03/09/2013, No significant change was found Confirmed by [facility]   MD, Hildur Bayer ([id number]) on 04/12/2015 5:49:47 AM      MDM   Final diagnoses:  Nausea vomiting and diarrhea  Elevated serum creatinine  Atypical chest pain    Vomiting, diarrhea, chest discomfort. The fact that all of these symptoms came on at the same time suggests a viral gastroenteritis. Old records are reviewed  and he had been admitted to the hospital in 2014 for chest pain and had negative workup including negative nuclear stress test. ECG is unchanged. He will be given IV fluids, IV ondansetron. Loperamide will be withheld unless he starts to feel like he is going to have more diarrhea.  He feels much better after above noted treatment. Creatinine is noted to be slightly elevated may be related to dehydration. This will need to be followed as an outpatient. Discharged with prescription for ondansetron.  Delora Fuel, MD 54/65/68  [id number] 

## 2015-04-12 NOTE — ED Notes (Signed)
Pt states he started having trouble breathing and woke up feeling that way. States he is having cp and then his left shoulder started hurting but states he doesn't know if the pain moved into the shoulder. Also reports some vomiting and diarrhea.

## 2015-04-12 NOTE — Discharge Instructions (Signed)
Take loperamide (Imodium AD) as needed for diarrhea. ° °Nausea and Vomiting °Nausea is a sick feeling that often comes before throwing up (vomiting). Vomiting is a reflex where stomach contents come out of your mouth. Vomiting can cause severe loss of body fluids (dehydration). Children and elderly adults can become dehydrated quickly, especially if they also have diarrhea. Nausea and vomiting are symptoms of a condition or disease. It is important to find the cause of your symptoms. °CAUSES  °· Direct irritation of the stomach lining. This irritation can result from increased acid production (gastroesophageal reflux disease), infection, food poisoning, taking certain medicines (such as nonsteroidal anti-inflammatory drugs), alcohol use, or tobacco use. °· Signals from the brain. These signals could be caused by a headache, heat exposure, an inner ear disturbance, increased pressure in the brain from injury, infection, a tumor, or a concussion, pain, emotional stimulus, or metabolic problems. °· An obstruction in the gastrointestinal tract (bowel obstruction). °· Illnesses such as diabetes, hepatitis, gallbladder problems, appendicitis, kidney problems, cancer, sepsis, atypical symptoms of a heart attack, or eating disorders. °· Medical treatments such as chemotherapy and radiation. °· Receiving medicine that makes you sleep (general anesthetic) during surgery. °DIAGNOSIS °Your caregiver may ask for tests to be done if the problems do not improve after a few days. Tests may also be done if symptoms are severe or if the reason for the nausea and vomiting is not clear. Tests may include: °· Urine tests. °· Blood tests. °· Stool tests. °· Cultures (to look for evidence of infection). °· X-rays or other imaging studies. °Test results can help your caregiver make decisions about treatment or the need for additional tests. °TREATMENT °You need to stay well hydrated. Drink frequently but in small amounts. You may wish to  drink water, sports drinks, clear broth, or eat frozen ice pops or gelatin dessert to help stay hydrated. When you eat, eating slowly may help prevent nausea. There are also some antinausea medicines that may help prevent nausea. °HOME CARE INSTRUCTIONS  °· Take all medicine as directed by your caregiver. °· If you do not have an appetite, do not force yourself to eat. However, you must continue to drink fluids. °· If you have an appetite, eat a normal diet unless your caregiver tells you differently. °· Eat a variety of complex carbohydrates (rice, wheat, potatoes, bread), lean meats, yogurt, fruits, and vegetables. °· Avoid high-fat foods because they are more difficult to digest. °· Drink enough water and fluids to keep your urine clear or pale yellow. °· If you are dehydrated, ask your caregiver for specific rehydration instructions. Signs of dehydration may include: °· Severe thirst. °· Dry lips and mouth. °· Dizziness. °· Dark urine. °· Decreasing urine frequency and amount. °· Confusion. °· Rapid breathing or pulse. °SEEK IMMEDIATE MEDICAL CARE IF:  °· You have blood or brown flecks (like coffee grounds) in your vomit. °· You have black or bloody stools. °· You have a severe headache or stiff neck. °· You are confused. °· You have severe abdominal pain. °· You have chest pain or trouble breathing. °· You do not urinate at least once every 8 hours. °· You develop cold or clammy skin. °· You continue to vomit for longer than 24 to 48 hours. °· You have a fever. °MAKE SURE YOU:  °· Understand these instructions. °· Will watch your condition. °· Will get help right away if you are not doing well or get worse. °Document Released: 08/31/2005 Document Revised: 11/23/2011 Document Reviewed: 01/28/2011 °[employer]® Patient   Information 2015 [place], [place]. This information is not intended to replace advice given to you by your health care provider. Make sure you discuss any questions you have with your health care  provider.  Diarrhea Diarrhea is frequent loose and watery bowel movements. It can cause you to feel weak and dehydrated. Dehydration can cause you to become tired and thirsty, have a dry mouth, and have decreased urination that often is dark yellow. Diarrhea is a sign of another problem, most often an infection that will not last long. In most cases, diarrhea typically lasts 2-3 days. However, it can last longer if it is a sign of something more serious. It is important to treat your diarrhea as directed by your caregiver to lessen or prevent future episodes of diarrhea. CAUSES  Some common causes include:  Gastrointestinal infections caused by viruses, bacteria, or parasites.  Food poisoning or food allergies.  Certain medicines, such as antibiotics, chemotherapy, and laxatives.  Artificial sweeteners and fructose.  Digestive disorders. HOME CARE INSTRUCTIONS  Ensure adequate fluid intake (hydration): Have 1 cup (8 oz) of fluid for each diarrhea episode. Avoid fluids that contain simple sugars or sports drinks, fruit juices, whole milk products, and sodas. Your urine should be clear or pale yellow if you are drinking enough fluids. Hydrate with an oral rehydration solution that you can purchase at pharmacies, retail stores, and online. You can prepare an oral rehydration solution at home by mixing the following ingredients together:   - tsp table salt.   tsp baking soda.   tsp salt substitute containing potassium chloride.  1  tablespoons sugar.  1 L (34 oz) of water.  Certain foods and beverages may increase the speed at which food moves through the gastrointestinal (GI) tract. These foods and beverages should be avoided and include:  Caffeinated and alcoholic beverages.  High-fiber foods, such as raw fruits and vegetables, nuts, seeds, and whole grain breads and cereals.  Foods and beverages sweetened with sugar alcohols, such as xylitol, sorbitol, and mannitol.  Some foods  may be well tolerated and may help thicken stool including:  Starchy foods, such as rice, toast, pasta, low-sugar cereal, oatmeal, grits, baked potatoes, crackers, and bagels.  Bananas.  Applesauce.  Add probiotic-rich foods to help increase healthy bacteria in the GI tract, such as yogurt and fermented milk products.  Wash your hands well after each diarrhea episode.  Only take over-the-counter or prescription medicines as directed by your caregiver.  Take a warm bath to relieve any burning or pain from frequent diarrhea episodes. SEEK IMMEDIATE MEDICAL CARE IF:   You are unable to keep fluids down.  You have persistent vomiting.  You have blood in your stool, or your stools are black and tarry.  You do not urinate in 6-8 hours, or there is only a small amount of very dark urine.  You have abdominal pain that increases or localizes.  You have weakness, dizziness, confusion, or light-headedness.  You have a severe headache.  Your diarrhea gets worse or does not get better.  You have a fever or persistent symptoms for more than 2-3 days.  You have a fever and your symptoms suddenly get worse. MAKE SURE YOU:   Understand these instructions.  Will watch your condition.  Will get help right away if you are not doing well or get worse. Document Released: 08/21/2002 Document Revised: 01/15/2014 Document Reviewed: 05/08/2012 [facility] Patient Information 2015 [place], [place]. This information is not intended to replace advice given to you  by your health care provider. Make sure you discuss any questions you have with your health care provider.  Ondansetron tablets What is this medicine? ONDANSETRON (on DAN se tron) is used to treat nausea and vomiting caused by chemotherapy. It is also used to prevent or treat nausea and vomiting after surgery. This medicine may be used for other purposes; ask your health care provider or pharmacist if you have questions. COMMON BRAND  NAME(S): Zofran What should I tell my health care provider before I take this medicine? They need to know if you have any of these conditions: -heart disease -history of irregular heartbeat -liver disease -low levels of magnesium or potassium in the blood -an unusual or allergic reaction to ondansetron, granisetron, other medicines, foods, dyes, or preservatives -pregnant or trying to get pregnant -breast-feeding How should I use this medicine? Take this medicine by mouth with a glass of water. Follow the directions on your prescription label. Take your doses at regular intervals. Do not take your medicine more often than directed. Talk to your pediatrician regarding the use of this medicine in children. Special care may be needed. Overdosage: If you think you have taken too much of this medicine contact a poison control center or emergency room at once. NOTE: This medicine is only for you. Do not share this medicine with others. What if I miss a dose? If you miss a dose, take it as soon as you can. If it is almost time for your next dose, take only that dose. Do not take double or extra doses. What may interact with this medicine? Do not take this medicine with any of the following medications: -apomorphine -certain medicines for fungal infections like fluconazole, itraconazole, ketoconazole, posaconazole, voriconazole -cisapride -dofetilide -dronedarone -pimozide -thioridazine -ziprasidone This medicine may also interact with the following medications: -carbamazepine -certain medicines for depression, anxiety, or psychotic disturbances -fentanyl -linezolid -MAOIs like Carbex, Eldepryl, Marplan, Nardil, and Parnate -methylene blue (injected into a vein) -other medicines that prolong the QT interval (cause an abnormal heart rhythm) -phenytoin -rifampicin -tramadol This list may not describe all possible interactions. Give your health care provider a list of all the medicines,  herbs, non-prescription drugs, or dietary supplements you use. Also tell them if you smoke, drink alcohol, or use illegal drugs. Some items may interact with your medicine. What should I watch for while using this medicine? Check with your doctor or health care professional right away if you have any sign of an allergic reaction. What side effects may I notice from receiving this medicine? Side effects that you should report to your doctor or health care professional as soon as possible: -allergic reactions like skin rash, itching or hives, swelling of the face, lips or tongue -breathing problems -confusion -dizziness -fast or irregular heartbeat -feeling faint or lightheaded, falls -fever and chills -loss of balance or coordination -seizures -sweating -swelling of the hands or feet -tightness in the chest -tremors -unusually weak or tired Side effects that usually do not require medical attention (report to your doctor or health care professional if they continue or are bothersome): -constipation or diarrhea -headache This list may not describe all possible side effects. Call your doctor for medical advice about side effects. You may report side effects to FDA at 1-800-FDA-1088. Where should I keep my medicine? Keep out of the reach of children. Store between 2 and 30 degrees C (36 and 86 degrees F). Throw away any unused medicine after the expiration date. NOTE: This sheet is a  summary. It may not cover all possible information. If you have questions about this medicine, talk to your doctor, pharmacist, or health care provider.  2015, [employer] Standard. (2013-06-07 16:27:45)   Chest Pain (Nonspecific) It is often hard to give a specific diagnosis for the cause of chest pain. There is always a chance that your pain could be related to something serious, such as a heart attack or a blood clot in the lungs. You need to follow up with your health care provider for further  evaluation. CAUSES   Heartburn.  Pneumonia or bronchitis.  Anxiety or stress.  Inflammation around your heart (pericarditis) or lung (pleuritis or pleurisy).  A blood clot in the lung.  A collapsed lung (pneumothorax). It can develop suddenly on its own (spontaneous pneumothorax) or from trauma to the chest.  Shingles infection (herpes zoster virus). The chest wall is composed of bones, muscles, and cartilage. Any of these can be the source of the pain.  The bones can be bruised by injury.  The muscles or cartilage can be strained by coughing or overwork.  The cartilage can be affected by inflammation and become sore (costochondritis). DIAGNOSIS  Lab tests or other studies may be needed to find the cause of your pain. Your health care provider may have you take a test called an ambulatory electrocardiogram (ECG). An ECG records your heartbeat patterns over a 24-hour period. You may also have other tests, such as:  Transthoracic echocardiogram (TTE). During echocardiography, sound waves are used to evaluate how blood flows through your heart.  Transesophageal echocardiogram (TEE).  Cardiac monitoring. This allows your health care provider to monitor your heart rate and rhythm in real time.  Holter monitor. This is a portable device that records your heartbeat and can help diagnose heart arrhythmias. It allows your health care provider to track your heart activity for several days, if needed.  Stress tests by exercise or by giving medicine that makes the heart beat faster. TREATMENT   Treatment depends on what may be causing your chest pain. Treatment may include:  Acid blockers for heartburn.  Anti-inflammatory medicine.  Pain medicine for inflammatory conditions.  Antibiotics if an infection is present.  You may be advised to change lifestyle habits. This includes stopping smoking and avoiding alcohol, caffeine, and chocolate.  You may be advised to keep your head  raised (elevated) when sleeping. This reduces the chance of acid going backward from your stomach into your esophagus. Most of the time, nonspecific chest pain will improve within 2-3 days with rest and mild pain medicine.  HOME CARE INSTRUCTIONS   If antibiotics were prescribed, take them as directed. Finish them even if you start to feel better.  For the next few days, avoid physical activities that bring on chest pain. Continue physical activities as directed.  Do not use any tobacco products, including cigarettes, chewing tobacco, or electronic cigarettes.  Avoid drinking alcohol.  Only take medicine as directed by your health care provider.  Follow your health care provider's suggestions for further testing if your chest pain does not go away.  Keep any follow-up appointments you made. If you do not go to an appointment, you could develop lasting (chronic) problems with pain. If there is any problem keeping an appointment, call to reschedule. SEEK MEDICAL CARE IF:   Your chest pain does not go away, even after treatment.  You have a rash with blisters on your chest.  You have a fever. SEEK IMMEDIATE MEDICAL CARE IF:  You have increased chest pain or pain that spreads to your arm, neck, jaw, back, or abdomen.  You have shortness of breath.  You have an increasing cough, or you cough up blood.  You have severe back or abdominal pain.  You feel nauseous or vomit.  You have severe weakness.  You faint.  You have chills. This is an emergency. Do not wait to see if the pain will go away. Get medical help at once. Call your local emergency services (911 in [place] Do not drive yourself to the hospital. MAKE SURE YOU:   Understand these instructions.  Will watch your condition.  Will get help right away if you are not doing well or get worse. Document Released: 06/10/2005 Document Revised: 09/05/2013 Document Reviewed: 04/05/2008 [facility] Patient Information 2015  [place], [place]. This information is not intended to replace advice given to you by your health care provider. Make sure you discuss any questions you have with your health care provider.

## 2015-04-19 ENCOUNTER — Ambulatory Visit (INDEPENDENT_AMBULATORY_CARE_PROVIDER_SITE_OTHER): Payer: BC Managed Care – PPO | Admitting: Family Medicine

## 2015-04-19 ENCOUNTER — Encounter: Payer: Self-pay | Admitting: Family Medicine

## 2015-04-19 VITALS — BP 132/80 | HR 82 | Temp 98.2°F | Wt 242.0 lb

## 2015-04-19 DIAGNOSIS — R112 Nausea with vomiting, unspecified: Secondary | ICD-10-CM

## 2015-04-19 DIAGNOSIS — F419 Anxiety disorder, unspecified: Secondary | ICD-10-CM

## 2015-04-19 DIAGNOSIS — Z720 Tobacco use: Secondary | ICD-10-CM | POA: Diagnosis not present

## 2015-04-19 DIAGNOSIS — N528 Other male erectile dysfunction: Secondary | ICD-10-CM

## 2015-04-19 DIAGNOSIS — N529 Male erectile dysfunction, unspecified: Secondary | ICD-10-CM | POA: Insufficient documentation

## 2015-04-19 DIAGNOSIS — F17200 Nicotine dependence, unspecified, uncomplicated: Secondary | ICD-10-CM

## 2015-04-19 LAB — BASIC METABOLIC PANEL
BUN: 21 mg/dL (ref 6–23)
CHLORIDE: 102 meq/L (ref 96–112)
CO2: 30 mEq/L (ref 19–32)
CREATININE: 1.32 mg/dL (ref 0.40–1.50)
Calcium: 9.9 mg/dL (ref 8.4–10.5)
GFR: 59.74 mL/min — ABNORMAL LOW (ref >60.00)
Glucose, Bld: 91 mg/dL (ref 70–99)
Potassium: 4.9 mEq/L (ref 3.5–5.1)
Sodium: 137 mEq/L (ref 135–145)

## 2015-04-19 MED ORDER — FLUOXETINE HCL 20 MG PO TABS: 20.0000 mg | ORAL_TABLET | Freq: Every day | ORAL | Status: DC

## 2015-04-19 MED ORDER — TADALAFIL 20 MG PO TABS: ORAL_TABLET | ORAL | Status: DC

## 2015-04-19 MED ORDER — PANTOPRAZOLE SODIUM 40 MG PO TBEC: 40.0000 mg | DELAYED_RELEASE_TABLET | Freq: Every day | ORAL | Status: DC

## 2015-04-19 NOTE — Progress Notes (Signed)
Pre visit review using our clinic review tool, if applicable. No additional management support is needed unless otherwise documented below in the visit note. 

## 2015-04-19 NOTE — Progress Notes (Signed)
Subjective:    Patient ID: Thomas Curtis, male    DOB: 10/02/1960, 54 y.o.   MRN: [id number]  HPI Patient here today to evaluate and discuss several issues:  Recent ER visit on 7-29 for shortness of breath and chest pain. He woke up with nausea and vomiting and diarrhea and subsequently developed some shortness of breath and chest pain. He had 4 episodes of emesis and diarrhea along with some chills but no fever. He presented to ER for further evaluation. Vital signs were stable. EKG unremarkable. Chest x-ray no focal abnormalities. Labs significant for creatinine slightly elevated 1.3. Troponins were negative. CBC normal. Was felt that he had probably viral gastroenteritis and symptoms are improving. He's had previous negative cardiac evaluation including negative nuclear stress test 2014.  He does have some intermittent nausea and is using Zofran for that. No further vomiting and diarrhea has resolved. Denies abdominal pain.  He denies any consistent chest pain symptoms. His job is fairly physical and has not had any consistent symptoms of chest pain with that. Both patient and his wife stated that he is extremely anxious and has low frustration tolerance. He previously took Celexa seemed to benefit for quite some time but seemed to lose its effect over time. He denies any specific stressors other than work and family.  Ongoing nicotine use. Desires to quit. Has had no benefit previously with Chantix. Motivation is fair  Erectile dysfunction. Patient has ongoing nicotine use as above. No known peripheral vascular disease. Normal libido. He is specifically requesting trial of medication to help with ED.  Past Medical History  Diagnosis Date  . Chicken pox   . GERD (gastroesophageal reflux disease)   . Colon polyps 2010  . Anxiety   . Anginal pain   . Depression    Past Surgical History  Procedure Laterality Date  . None      reports that he has been smoking Cigarettes.  He has a  35 pack-year smoking history. He does not have any smokeless tobacco history on file. He reports that he drinks alcohol. He reports that he does not use illicit drugs. family history includes Cancer in his father; Hypertension in his father and mother. No Known Allergies    Review of Systems  Constitutional: Negative for fever and chills.  HENT: Negative for trouble swallowing.   Respiratory: Negative for cough and shortness of breath.   Cardiovascular: Negative for chest pain and palpitations.  Gastrointestinal: Positive for nausea. Negative for vomiting, abdominal pain and diarrhea.  Genitourinary: Negative for dysuria.  Neurological: Negative for dizziness.  Psychiatric/Behavioral: Negative for suicidal ideas and dysphoric mood. The patient is nervous/anxious.        Objective:   Physical Exam  Constitutional: He appears well-developed and well-nourished.  Neck: Neck supple. No thyromegaly present.  Cardiovascular: Normal rate and regular rhythm.   Pulmonary/Chest: Effort normal and breath sounds normal. No respiratory distress. He has no wheezes. He has no rales.  Musculoskeletal: He exhibits no edema.  Lymphadenopathy:    He has no cervical adenopathy.  Psychiatric:  Patient is generally anxious but appropriate in interaction otherwise          Assessment & Plan:  #1 recent probable viral gastritis. Symptoms resolving. He had bump in creatinine likely related to his vomiting and diarrhea. Repeat basic metabolic panel #2 chronic anxiety. This is fairly pervasive (daily) and chronic. He has difficulties with impulse control. We discussed possibility of counseling and he is not interested. We  recommend starting fluoxetine 20 mg once daily and reassess one month.  Avoid regular use of benzodiazepines. #3 ongoing nicotine use. Pamphlet given for [facility] quit line. He prefers to try over-the-counter nicotine replacement. Previously took Chantix which did not work well for  him #4 erectile dysfunction. Recommend trial Cialis 20 mg one half to one tablet every other day as needed. No contraindications.

## 2015-08-26 ENCOUNTER — Telehealth: Payer: Self-pay | Admitting: Family Medicine

## 2015-08-26 NOTE — Telephone Encounter (Signed)
Vanita Ingles stated that she talked with the patient wife and gave outcome of going to the ED. They refused ED he wants see Dr. Elease Hashimoto. Patient loss his vision for about hours and has come back. Patient has had a horrible headache all day. Patient wife said stated that he would not go to the ED. Patient wife gave an alternate number to due to phone dying [phone]

## 2015-08-26 NOTE — Telephone Encounter (Signed)
Patient Name: Thomas Curtis  DOB: 06/30/1961    Initial Comment Caller states he lost his vision at work today. All he can see was like cloud of rain, horrible headache.   Nurse Assessment  Nurse: Raphael Gibney, RN, Vanita Ingles Date/Time Eilene Ghazi Time): 08/26/2015 3:06:22 PM  Confirm and document reason for call. If symptomatic, describe symptoms. ---Caller states spouse is at work. She is not with him. He has a horrible headache on his forehead all day. Has a lot of anxiety. He lost his vision for about an hr and then it came back. Vision looked like "rain". he was on his break. Unable to contact pt at work.  Has the patient traveled out of the country within the last 30 days? ---Not Applicable  Does the patient have any new or worsening symptoms? ---Yes  Will a triage be completed? ---No  Select reason for no triage. ---Other  Please document clinical information provided and list any resource used. ---Pt is at work and called spouse on his break. She is unable to call him at work. As per nursing knowledge, advised spouse if he lost his vision for an hr and has a severe headache, he needs to go to the ER. States he will not go to the ER and would like appt with Dr. Elease Hashimoto tomorrow.     Guidelines    Guideline Title Affirmed Question Affirmed Notes       Final Disposition User        Comments  Called back line at office and spoke to [place] and gave report that pt lost vision for an hr at work and it has come back but has a severe headache with ER outcome. He is currently at work and I did not speak directly with the pt but with his spouse. Spouse states he will not go to the ER but would prefer to see Dr. Elease Hashimoto tomorrow.

## 2015-08-26 NOTE — Telephone Encounter (Signed)
Spoke with patient. In the last 8 months he has had one other episode which last 2 hours. He describes his sx as blurry vision like a constant camera flash in his eyes and/or water running over his eye constantly. The episode lasted approx 1 hour. When this happens he is unable to do anything due to almost being blind. Associated sx was frontal headache and slight dizziness. Mother has been DX with macular degenerative disease. Pt is aware that we will follow-up with him on 08/27/15 to discuss scheduling an appt.

## 2015-08-27 NOTE — Telephone Encounter (Signed)
Closing note. Please reference triage encounter.

## 2015-08-27 NOTE — Telephone Encounter (Signed)
FYI

## 2015-08-27 NOTE — Telephone Encounter (Signed)
Patient's work schedule is unique. Can we please try to get him in for appointment this week

## 2015-08-27 NOTE — Telephone Encounter (Signed)
Sch for Wed 12/14 at 11:00am

## 2015-08-28 ENCOUNTER — Ambulatory Visit: Payer: BC Managed Care – PPO | Admitting: Family Medicine

## 2015-08-29 ENCOUNTER — Ambulatory Visit (INDEPENDENT_AMBULATORY_CARE_PROVIDER_SITE_OTHER): Payer: BC Managed Care – PPO | Admitting: Family Medicine

## 2015-08-29 ENCOUNTER — Encounter: Payer: Self-pay | Admitting: Family Medicine

## 2015-08-29 VITALS — BP 130/88 | HR 89 | Temp 98.1°F | Resp 16 | Ht 72.0 in | Wt 247.9 lb

## 2015-08-29 DIAGNOSIS — J209 Acute bronchitis, unspecified: Secondary | ICD-10-CM | POA: Diagnosis not present

## 2015-08-29 DIAGNOSIS — G43809 Other migraine, not intractable, without status migrainosus: Secondary | ICD-10-CM

## 2015-08-29 DIAGNOSIS — G43109 Migraine with aura, not intractable, without status migrainosus: Secondary | ICD-10-CM

## 2015-08-29 MED ORDER — AMOXICILLIN-POT CLAVULANATE 875-125 MG PO TABS: 1.0000 | ORAL_TABLET | Freq: Two times a day (BID) | ORAL | Status: DC

## 2015-08-29 MED ORDER — SUMATRIPTAN SUCCINATE 100 MG PO TABS: 100.0000 mg | ORAL_TABLET | ORAL | Status: AC | PRN

## 2015-08-29 MED ORDER — PANTOPRAZOLE SODIUM 40 MG PO TBEC: 40.0000 mg | DELAYED_RELEASE_TABLET | Freq: Every day | ORAL | Status: DC

## 2015-08-29 NOTE — Progress Notes (Signed)
Pre visit review using our clinic review tool, if applicable. No additional management support is needed unless otherwise documented below in the visit note. 

## 2015-08-29 NOTE — Progress Notes (Signed)
   Subjective:    Patient ID: Thomas Curtis, male    DOB: 05-05-1961, 54 y.o.   MRN: [id number]  HPI Patient seen to evaluate intermittent bilateral blurred vision Over past 8 to 9 months estimates 5-6 episodes of transient blurred vision Episodes start fairly suddenly and usually last anywhere from 30 minutes to 1 hour. He has bilateral blurred vision which is mostly peripheral vision and then generalizes. Never had total loss of vision. No diplopia. No associated speech changes or focal weakness.  Each episode has been followed by headache which is frequently frontal and retro-orbital and sometimes unilateral. No nausea or vomiting. He is taken some Advil which usually does not help. Headaches can last several hours but visual symptoms are almost always 1 hour or less. Not aware of clear triggers. Never had unilateral visual symptoms.  Positive family history of macular degeneration and he was concerned he may have some hereditary issue.  Second issue is over one month history of cough recently productive of yellow to green sputum. No fevers or chills. No hemoptysis. No appetite or weight changes. No dyspnea. Still smokes.  Past Medical History  Diagnosis Date  . Chicken pox   . GERD (gastroesophageal reflux disease)   . Colon polyps 2010  . Anxiety   . Anginal pain ([place])   . Depression    Past Surgical History  Procedure Laterality Date  . None      reports that he has been smoking Cigarettes.  He has a 35 pack-year smoking history. He does not have any smokeless tobacco history on file. He reports that he drinks alcohol. He reports that he does not use illicit drugs. family history includes Cancer in his father; Hypertension in his father and mother. No Known Allergies    Review of Systems  Constitutional: Negative for fever and chills.  Eyes: Positive for visual disturbance. Negative for photophobia, pain, discharge and redness.  Respiratory: Positive for cough.  Negative for shortness of breath and wheezing.   Cardiovascular: Negative for chest pain and palpitations.  Neurological: Positive for headaches. Negative for seizures, syncope, speech difficulty and weakness.  Psychiatric/Behavioral: Negative for confusion.       Objective:   Physical Exam  Constitutional: He is oriented to person, place, and time. He appears well-developed and well-nourished.  Eyes: Pupils are equal, round, and reactive to light.  Neck: Neck supple. No JVD present. No thyromegaly present.  Cardiovascular: Normal rate and regular rhythm.   Pulmonary/Chest: Effort normal and breath sounds normal. No respiratory distress. He has no wheezes. He has no rales.  Neurological: He is alert and oriented to person, place, and time. He has normal reflexes. No cranial nerve deficit. Coordination normal.  Full strength throughout          Assessment & Plan:  #1 intermittent bilateral visual symptoms. Symptoms strongly suggest ocular migraine. He does have sometimes several hours of subsequent headache. Look for triggers. Keep headache diary. Imitrex 100 mg start at onset and may repeat once in 2 hours if needed. Follow-up immediately if he has a change in symptoms such as unilateral visual change or focal neurologic symptoms He is encouraged to get a good complete eye exam and he is in process of setting this up. #2 cough. Suspect acute bronchitis. Given duration of symptoms start Augmentin 875 mg twice daily for 7 days. He is strongly encouraged to stop smoking

## 2015-08-29 NOTE — Patient Instructions (Addendum)
Migraine Headache A migraine headache is an intense, throbbing pain on one or both sides of your head. A migraine can last for 30 minutes to several hours. CAUSES  The exact cause of a migraine headache is not always known. However, a migraine may be caused when nerves in the brain become irritated and release chemicals that cause inflammation. This causes pain. Certain things may also trigger migraines, such as:  Alcohol.  Smoking.  Stress.  Menstruation.  Aged cheeses.  Foods or drinks that contain nitrates, glutamate, aspartame, or tyramine.  Lack of sleep.  Chocolate.  Caffeine.  Hunger.  Physical exertion.  Fatigue.  Medicines used to treat chest pain (nitroglycerine), birth control pills, estrogen, and some blood pressure medicines. SIGNS AND SYMPTOMS  Pain on one or both sides of your head.  Pulsating or throbbing pain.  Severe pain that prevents daily activities.  Pain that is aggravated by any physical activity.  Nausea, vomiting, or both.  Dizziness.  Pain with exposure to bright lights, loud noises, or activity.  General sensitivity to bright lights, loud noises, or smells. Before you get a migraine, you may get warning signs that a migraine is coming (aura). An aura may include:  Seeing flashing lights.  Seeing bright spots, halos, or zigzag lines.  Having tunnel vision or blurred vision.  Having feelings of numbness or tingling.  Having trouble talking.  Having muscle weakness. DIAGNOSIS  A migraine headache is often diagnosed based on:  Symptoms.  Physical exam.  A CT scan or MRI of your head. These imaging tests cannot diagnose migraines, but they can help rule out other causes of headaches. TREATMENT Medicines may be given for pain and nausea. Medicines can also be given to help prevent recurrent migraines.  HOME CARE INSTRUCTIONS  Only take over-the-counter or prescription medicines for pain or discomfort as directed by your  health care provider. The use of long-term narcotics is not recommended.  Lie down in a dark, quiet room when you have a migraine.  Keep a journal to find out what may trigger your migraine headaches. For example, write down:  What you eat and drink.  How much sleep you get.  Any change to your diet or medicines.  Limit alcohol consumption.  Quit smoking if you smoke.  Get 7-9 hours of sleep, or as recommended by your health care provider.  Limit stress.  Keep lights dim if bright lights bother you and make your migraines worse. SEEK IMMEDIATE MEDICAL CARE IF:   Your migraine becomes severe.  You have a fever.  You have a stiff neck.  You have vision loss.  You have muscular weakness or loss of muscle control.  You start losing your balance or have trouble walking.  You feel faint or pass out.  You have severe symptoms that are different from your first symptoms. MAKE SURE YOU:   Understand these instructions.  Will watch your condition.  Will get help right away if you are not doing well or get worse.   This information is not intended to replace advice given to you by your health care provider. Make sure you discuss any questions you have with your health care provider.   Document Released: 08/31/2005 Document Revised: 09/21/2014 Document Reviewed: 05/08/2013 Elsevier Interactive Patient Education 2016 [place].  Set up eye exam Follow up for any unilateral loss of vision. Or any persistent loss of vision ( longer that a few hours).

## 2015-10-04 ENCOUNTER — Encounter: Payer: Self-pay | Admitting: Family Medicine

## 2015-10-04 ENCOUNTER — Ambulatory Visit (INDEPENDENT_AMBULATORY_CARE_PROVIDER_SITE_OTHER): Payer: BC Managed Care – PPO | Admitting: Family Medicine

## 2015-10-04 VITALS — BP 122/82 | HR 83 | Temp 98.2°F | Ht 72.0 in | Wt 249.9 lb

## 2015-10-04 DIAGNOSIS — M545 Low back pain, unspecified: Secondary | ICD-10-CM

## 2015-10-04 MED ORDER — PREDNISONE 10 MG PO TABS: ORAL_TABLET | ORAL | Status: DC

## 2015-10-04 MED ORDER — HYDROCODONE-ACETAMINOPHEN 5-325 MG PO TABS: 1.0000 | ORAL_TABLET | Freq: Four times a day (QID) | ORAL | Status: DC | PRN

## 2015-10-04 NOTE — Patient Instructions (Signed)
Low Back Sprain With Rehab A sprain is an injury in which a ligament is torn. The ligaments of the lower back are vulnerable to sprains. However, they are strong and require great force to be injured. These ligaments are important for stabilizing the spinal column. Sprains are classified into three categories. Grade 1 sprains cause pain, but the tendon is not lengthened. Grade 2 sprains include a lengthened ligament, due to the ligament being stretched or partially ruptured. With grade 2 sprains there is still function, although the function may be decreased. Grade 3 sprains involve a complete tear of the tendon or muscle, and function is usually impaired. SYMPTOMS   Severe pain in the lower back.  Sometimes, a feeling of a "pop," "snap," or tear, at the time of injury.  Tenderness and sometimes swelling at the injury site.  Uncommonly, bruising (contusion) within 48 hours of injury.  Muscle spasms in the back. CAUSES  Low back sprains occur when a force is placed on the ligaments that is greater than they can handle. Common causes of injury include:  Performing a stressful act while off-balance.  Repetitive stressful activities that involve movement of the lower back.  Direct hit (trauma) to the lower back. RISK INCREASES WITH:  Contact sports (football, wrestling).  Collisions (major skiing accidents).  Sports that require throwing or lifting (baseball, weightlifting).  Sports involving twisting of the spine (gymnastics, diving, tennis, golf).  Poor strength and flexibility.  Inadequate protection.  Previous back injury or surgery (especially fusion). PREVENTION  Wear properly fitted and padded protective equipment.  Warm up and stretch properly before activity.  Allow for adequate recovery between workouts.  Maintain physical fitness:  Strength, flexibility, and endurance.  Cardiovascular fitness.  Maintain a healthy body weight. PROGNOSIS  If treated properly,  low back sprains usually heal with non-surgical treatment. The length of time for healing depends on the severity of the injury.  RELATED COMPLICATIONS   Recurring symptoms, resulting in a chronic problem.  Chronic inflammation and pain in the low back.  Delayed healing or resolution of symptoms, especially if activity is resumed too soon.  Prolonged impairment.  Unstable or arthritic joints of the low back. TREATMENT  Treatment first involves the use of ice and medicine, to reduce pain and inflammation. The use of strengthening and stretching exercises may help reduce pain with activity. These exercises may be performed at home or with a therapist. Severe injuries may require referral to a therapist for further evaluation and treatment, such as ultrasound. Your caregiver may advise that you wear a back brace or corset, to help reduce pain and discomfort. Often, prolonged bed rest results in greater harm then benefit. Corticosteroid injections may be recommended. However, these should be reserved for the most serious cases. It is important to avoid using your back when lifting objects. At night, sleep on your back on a firm mattress, with a pillow placed under your knees. If non-surgical treatment is unsuccessful, surgery may be needed.  MEDICATION   If pain medicine is needed, nonsteroidal anti-inflammatory medicines (aspirin and ibuprofen), or other minor pain relievers (acetaminophen), are often advised.  Do not take pain medicine for 7 days before surgery.  Prescription pain relievers may be given, if your caregiver thinks they are needed. Use only as directed and only as much as you need.  Ointments applied to the skin may be helpful.  Corticosteroid injections may be given by your caregiver. These injections should be reserved for the most serious cases, because   they may only be given a certain number of times. HEAT AND COLD  Cold treatment (icing) should be applied for 10 to 15  minutes every 2 to 3 hours for inflammation and pain, and immediately after activity that aggravates your symptoms. Use ice packs or an ice massage.  Heat treatment may be used before performing stretching and strengthening activities prescribed by your caregiver, physical therapist, or athletic trainer. Use a heat pack or a warm water soak. SEEK MEDICAL CARE IF:   Symptoms get worse or do not improve in 2 to 4 weeks, despite treatment.  You develop numbness or weakness in either leg.  You lose bowel or bladder function.  Any of the following occur after surgery: fever, increased pain, swelling, redness, drainage of fluids, or bleeding in the affected area.  New, unexplained symptoms develop. (Drugs used in treatment may produce side effects.) EXERCISES  RANGE OF MOTION (ROM) AND STRETCHING EXERCISES - Low Back Sprain Most people with lower back pain will find that their symptoms get worse with excessive bending forward (flexion) or arching at the lower back (extension). The exercises that will help resolve your symptoms will focus on the opposite motion.  Your physician, physical therapist or athletic trainer will help you determine which exercises will be most helpful to resolve your lower back pain. Do not complete any exercises without first consulting with your caregiver. Discontinue any exercises which make your symptoms worse, until you speak to your caregiver. If you have pain, numbness or tingling which travels down into your buttocks, leg or foot, the goal of the therapy is for these symptoms to move closer to your back and eventually resolve. Sometimes, these leg symptoms will get better, but your lower back pain may worsen. This is often an indication of progress in your rehabilitation. Be very alert to any changes in your symptoms and the activities in which you participated in the 24 hours prior to the change. Sharing this information with your caregiver will allow him or her to most  efficiently treat your condition. These exercises may help you when beginning to rehabilitate your injury. Your symptoms may resolve with or without further involvement from your physician, physical therapist or athletic trainer. While completing these exercises, remember:   Restoring tissue flexibility helps normal motion to return to the joints. This allows healthier, less painful movement and activity.  An effective stretch should be held for at least 30 seconds.  A stretch should never be painful. You should only feel a gentle lengthening or release in the stretched tissue. FLEXION RANGE OF MOTION AND STRETCHING EXERCISES: STRETCH - Flexion, Single Knee to Chest   Lie on a firm bed or floor with both legs extended in front of you.  Keeping one leg in contact with the floor, bring your opposite knee to your chest. Hold your leg in place by either grabbing behind your thigh or at your knee.  Pull until you feel a gentle stretch in your low back. Hold __________ seconds.  Slowly release your grasp and repeat the exercise with the opposite side. Repeat __________ times. Complete this exercise __________ times per day.  STRETCH - Flexion, Double Knee to Chest  Lie on a firm bed or floor with both legs extended in front of you.  Keeping one leg in contact with the floor, bring your opposite knee to your chest.  Tense your stomach muscles to support your back and then lift your other knee to your chest. Hold your legs in   place by either grabbing behind your thighs or at your knees.  Pull both knees toward your chest until you feel a gentle stretch in your low back. Hold __________ seconds.  Tense your stomach muscles and slowly return one leg at a time to the floor. Repeat __________ times. Complete this exercise __________ times per day.  STRETCH - Low Trunk Rotation  Lie on a firm bed or floor. Keeping your legs in front of you, bend your knees so they are both pointed toward the  ceiling and your feet are flat on the floor.  Extend your arms out to the side. This will stabilize your upper body by keeping your shoulders in contact with the floor.  Gently and slowly drop both knees together to one side until you feel a gentle stretch in your low back. Hold for __________ seconds.  Tense your stomach muscles to support your lower back as you bring your knees back to the starting position. Repeat the exercise to the other side. Repeat __________ times. Complete this exercise __________ times per day  EXTENSION RANGE OF MOTION AND FLEXIBILITY EXERCISES: STRETCH - Extension, Prone on Elbows   Lie on your stomach on the floor, a bed will be too soft. Place your palms about shoulder width apart and at the height of your head.  Place your elbows under your shoulders. If this is too painful, stack pillows under your chest.  Allow your body to relax so that your hips drop lower and make contact more completely with the floor.  Hold this position for __________ seconds.  Slowly return to lying flat on the floor. Repeat __________ times. Complete this exercise __________ times per day.  RANGE OF MOTION - Extension, Prone Press Ups  Lie on your stomach on the floor, a bed will be too soft. Place your palms about shoulder width apart and at the height of your head.  Keeping your back as relaxed as possible, slowly straighten your elbows while keeping your hips on the floor. You may adjust the placement of your hands to maximize your comfort. As you gain motion, your hands will come more underneath your shoulders.  Hold this position __________ seconds.  Slowly return to lying flat on the floor. Repeat __________ times. Complete this exercise __________ times per day.  RANGE OF MOTION- Quadruped, Neutral Spine   Assume a hands and knees position on a firm surface. Keep your hands under your shoulders and your knees under your hips. You may place padding under your knees for  comfort.  Drop your head and point your tailbone toward the ground below you. This will round out your lower back like an angry cat. Hold this position for __________ seconds.  Slowly lift your head and release your tail bone so that your back sags into a large arch, like an old horse.  Hold this position for __________ seconds.  Repeat this until you feel limber in your low back.  Now, find your "sweet spot." This will be the most comfortable position somewhere between the two previous positions. This is your neutral spine. Once you have found this position, tense your stomach muscles to support your low back.  Hold this position for __________ seconds. Repeat __________ times. Complete this exercise __________ times per day.  STRENGTHENING EXERCISES - Low Back Sprain These exercises may help you when beginning to rehabilitate your injury. These exercises should be done near your "sweet spot." This is the neutral, low-back arch, somewhere between fully rounded and   fully arched, that is your least painful position. When performed in this safe range of motion, these exercises can be used for people who have either a flexion or extension based injury. These exercises may resolve your symptoms with or without further involvement from your physician, physical therapist or athletic trainer. While completing these exercises, remember:   Muscles can gain both the endurance and the strength needed for everyday activities through controlled exercises.  Complete these exercises as instructed by your physician, physical therapist or athletic trainer. Increase the resistance and repetitions only as guided.  You may experience muscle soreness or fatigue, but the pain or discomfort you are trying to eliminate should never worsen during these exercises. If this pain does worsen, stop and make certain you are following the directions exactly. If the pain is still present after adjustments, discontinue the  exercise until you can discuss the trouble with your caregiver. STRENGTHENING - Deep Abdominals, Pelvic Tilt   Lie on a firm bed or floor. Keeping your legs in front of you, bend your knees so they are both pointed toward the ceiling and your feet are flat on the floor.  Tense your lower abdominal muscles to press your low back into the floor. This motion will rotate your pelvis so that your tail bone is scooping upwards rather than pointing at your feet or into the floor. With a gentle tension and even breathing, hold this position for __________ seconds. Repeat __________ times. Complete this exercise __________ times per day.  STRENGTHENING - Abdominals, Crunches   Lie on a firm bed or floor. Keeping your legs in front of you, bend your knees so they are both pointed toward the ceiling and your feet are flat on the floor. Cross your arms over your chest.  Slightly tip your chin down without bending your neck.  Tense your abdominals and slowly lift your trunk high enough to just clear your shoulder blades. Lifting higher can put excessive stress on the lower back and does not further strengthen your abdominal muscles.  Control your return to the starting position. Repeat __________ times. Complete this exercise __________ times per day.  STRENGTHENING - Quadruped, Opposite UE/LE Lift   Assume a hands and knees position on a firm surface. Keep your hands under your shoulders and your knees under your hips. You may place padding under your knees for comfort.  Find your neutral spine and gently tense your abdominal muscles so that you can maintain this position. Your shoulders and hips should form a rectangle that is parallel with the floor and is not twisted.  Keeping your trunk steady, lift your right hand no higher than your shoulder and then your left leg no higher than your hip. Make sure you are not holding your breath. Hold this position for __________ seconds.  Continuing to keep  your abdominal muscles tense and your back steady, slowly return to your starting position. Repeat with the opposite arm and leg. Repeat __________ times. Complete this exercise __________ times per day.  STRENGTHENING - Abdominals and Quadriceps, Straight Leg Raise   Lie on a firm bed or floor with both legs extended in front of you.  Keeping one leg in contact with the floor, bend the other knee so that your foot can rest flat on the floor.  Find your neutral spine, and tense your abdominal muscles to maintain your spinal position throughout the exercise.  Slowly lift your straight leg off the floor about 6 inches for a count of   15, making sure to not hold your breath.  Still keeping your neutral spine, slowly lower your leg all the way to the floor. Repeat this exercise with each leg __________ times. Complete this exercise __________ times per day. POSTURE AND BODY MECHANICS CONSIDERATIONS - Low Back Sprain Keeping correct posture when sitting, standing or completing your activities will reduce the stress put on different body tissues, allowing injured tissues a chance to heal and limiting painful experiences. The following are general guidelines for improved posture. Your physician or physical therapist will provide you with any instructions specific to your needs. While reading these guidelines, remember:  The exercises prescribed by your provider will help you have the flexibility and strength to maintain correct postures.  The correct posture provides the best environment for your joints to work. All of your joints have less wear and tear when properly supported by a spine with good posture. This means you will experience a healthier, less painful body.  Correct posture must be practiced with all of your activities, especially prolonged sitting and standing. Correct posture is as important when doing repetitive low-stress activities (typing) as it is when doing a single heavy-load  activity (lifting). RESTING POSITIONS Consider which positions are most painful for you when choosing a resting position. If you have pain with flexion-based activities (sitting, bending, stooping, squatting), choose a position that allows you to rest in a less flexed posture. You would want to avoid curling into a fetal position on your side. If your pain worsens with extension-based activities (prolonged standing, working overhead), avoid resting in an extended position such as sleeping on your stomach. Most people will find more comfort when they rest with their spine in a more neutral position, neither too rounded nor too arched. Lying on a non-sagging bed on your side with a pillow between your knees, or on your back with a pillow under your knees will often provide some relief. Keep in mind, being in any one position for a prolonged period of time, no matter how correct your posture, can still lead to stiffness. PROPER SITTING POSTURE In order to minimize stress and discomfort on your spine, you must sit with correct posture. Sitting with good posture should be effortless for a healthy body. Returning to good posture is a gradual process. Many people can work toward this most comfortably by using various supports until they have the flexibility and strength to maintain this posture on their own. When sitting with proper posture, your ears will fall over your shoulders and your shoulders will fall over your hips. You should use the back of the chair to support your upper back. Your lower back will be in a neutral position, just slightly arched. You may place a small pillow or folded towel at the base of your lower back for  support.  When working at a desk, create an environment that supports good, upright posture. Without extra support, muscles tire, which leads to excessive strain on joints and other tissues. Keep these recommendations in mind: CHAIR:  A chair should be able to slide under your desk  when your back makes contact with the back of the chair. This allows you to work closely.  The chair's height should allow your eyes to be level with the upper part of your monitor and your hands to be slightly lower than your elbows. BODY POSITION  Your feet should make contact with the floor. If this is not possible, use a foot rest.  Keep your ears   over your shoulders. This will reduce stress on your neck and low back. INCORRECT SITTING POSTURES  If you are feeling tired and unable to assume a healthy sitting posture, do not slouch or slump. This puts excessive strain on your back tissues, causing more damage and pain. Healthier options include:  Using more support, like a lumbar pillow.  Switching tasks to something that requires you to be upright or walking.  Talking a brief walk.  Lying down to rest in a neutral-spine position. PROLONGED STANDING WHILE SLIGHTLY LEANING FORWARD  When completing a task that requires you to lean forward while standing in one place for a long time, place either foot up on a stationary 2-4 inch high object to help maintain the best posture. When both feet are on the ground, the lower back tends to lose its slight inward curve. If this curve flattens (or becomes too large), then the back and your other joints will experience too much stress, tire more quickly, and can cause pain. CORRECT STANDING POSTURES Proper standing posture should be assumed with all daily activities, even if they only take a few moments, like when brushing your teeth. As in sitting, your ears should fall over your shoulders and your shoulders should fall over your hips. You should keep a slight tension in your abdominal muscles to brace your spine. Your tailbone should point down to the ground, not behind your body, resulting in an over-extended swayback posture.  INCORRECT STANDING POSTURES  Common incorrect standing postures include a forward head, locked knees and/or an excessive  swayback. WALKING Walk with an upright posture. Your ears, shoulders and hips should all line-up. PROLONGED ACTIVITY IN A FLEXED POSITION When completing a task that requires you to bend forward at your waist or lean over a low surface, try to find a way to stabilize 3 out of 4 of your limbs. You can place a hand or elbow on your thigh or rest a knee on the surface you are reaching across. This will provide you more stability, so that your muscles do not tire as quickly. By keeping your knees relaxed, or slightly bent, you will also reduce stress across your lower back. CORRECT LIFTING TECHNIQUES DO :  Assume a wide stance. This will provide you more stability and the opportunity to get as close as possible to the object which you are lifting.  Tense your abdominals to brace your spine. Bend at the knees and hips. Keeping your back locked in a neutral-spine position, lift using your leg muscles. Lift with your legs, keeping your back straight.  Test the weight of unknown objects before attempting to lift them.  Try to keep your elbows locked down at your sides in order get the best strength from your shoulders when carrying an object.  Always ask for help when lifting heavy or awkward objects. INCORRECT LIFTING TECHNIQUES DO NOT:   Lock your knees when lifting, even if it is a small object.  Bend and twist. Pivot at your feet or move your feet when needing to change directions.  Assume that you can safely pick up even a paperclip without proper posture.   This information is not intended to replace advice given to you by your health care provider. Make sure you discuss any questions you have with your health care provider.   Document Released: 08/31/2005 Document Revised: 09/21/2014 Document Reviewed: 12/13/2008 [employer] Interactive Patient Education 2016 [employer]  

## 2015-10-04 NOTE — Progress Notes (Signed)
   Subjective:    Patient ID: Thomas Curtis, male    DOB: 01/28/1961, 55 y.o.   MRN: [id number]  HPI  Acute visit for low back pain.   yesterday he was bending over feeding his dog and he coughed and felt a sudden sharp pain in his upper lumbar back. No radiation. He denies any lower extremity numbness or weakness. No urine or stool incontinence. He had one leftover hydrocodone which helped last night. He had taken one of his wife's Flexeril's which did not help. He also tried over-the-counter analgesics without relief. No fevers or chills.  Past Medical History  Diagnosis Date  . Chicken pox   . GERD (gastroesophageal reflux disease)   . Colon polyps 2010  . Anxiety   . Anginal pain ([place])   . Depression    Past Surgical History  Procedure Laterality Date  . None      reports that he has been smoking Cigarettes.  He has a 35 pack-year smoking history. He does not have any smokeless tobacco history on file. He reports that he drinks alcohol. He reports that he does not use illicit drugs. family history includes Cancer in his father; Hypertension in his father and mother. No Known Allergies    Review of Systems  Constitutional: Negative for fever, activity change and appetite change.  Respiratory: Negative for cough and shortness of breath.   Cardiovascular: Negative for chest pain and leg swelling.  Gastrointestinal: Negative for vomiting and abdominal pain.  Genitourinary: Negative for dysuria, hematuria and flank pain.  Musculoskeletal: Positive for back pain. Negative for joint swelling.  Neurological: Negative for weakness and numbness.       Objective:   Physical Exam  Constitutional: He is oriented to person, place, and time. He appears well-developed and well-nourished. No distress.  Neck: No thyromegaly present.  Cardiovascular: Normal rate, regular rhythm and normal heart sounds.   No murmur heard. Pulmonary/Chest: Effort normal and breath sounds normal. No  respiratory distress. He has no wheezes. He has no rales.  Musculoskeletal: He exhibits no edema.  Neurological: He is alert and oriented to person, place, and time. He has normal reflexes. No cranial nerve deficit.  Skin: No rash noted.          Assessment & Plan:   Lumbar back pain. Non-focal exam neurologically. Prednisone taper over the next 6 days. Wrote for limited hydrocodone 5 mg 1-2 every 6 hours for severe pain #30 with no refill. Try heat or ice for symptom relief. We reviewed some simple stretches. Consider physical therapy if no improvement in two weeks

## 2015-10-04 NOTE — Progress Notes (Signed)
Pre visit review using our clinic review tool, if applicable. No additional management support is needed unless otherwise documented below in the visit note. 

## 2016-02-13 ENCOUNTER — Other Ambulatory Visit: Payer: Self-pay | Admitting: Internal Medicine

## 2016-02-13 DIAGNOSIS — G43809 Other migraine, not intractable, without status migrainosus: Secondary | ICD-10-CM

## 2016-02-26 ENCOUNTER — Ambulatory Visit: Payer: BC Managed Care – PPO | Admitting: Neurology

## 2016-02-28 ENCOUNTER — Ambulatory Visit
Admission: RE | Admit: 2016-02-28 | Discharge: 2016-02-28 | Disposition: A | Discharge status: Home / Self Care | Payer: BC Managed Care – PPO | Source: Ambulatory Visit | Attending: Internal Medicine | Admitting: Internal Medicine

## 2016-02-28 DIAGNOSIS — G43809 Other migraine, not intractable, without status migrainosus: Secondary | ICD-10-CM

## 2016-03-04 ENCOUNTER — Ambulatory Visit (INDEPENDENT_AMBULATORY_CARE_PROVIDER_SITE_OTHER): Payer: BC Managed Care – PPO | Admitting: Neurology

## 2016-03-04 ENCOUNTER — Encounter: Payer: Self-pay | Admitting: Neurology

## 2016-03-04 VITALS — BP 144/82 | HR 67 | Ht 71.5 in | Wt 242.0 lb

## 2016-03-04 DIAGNOSIS — G43101 Migraine with aura, not intractable, with status migrainosus: Secondary | ICD-10-CM | POA: Diagnosis not present

## 2016-03-04 MED ORDER — TOPIRAMATE 50 MG PO TABS: 100.0000 mg | ORAL_TABLET | Freq: Every day | ORAL | Status: AC

## 2016-03-04 MED ORDER — RIZATRIPTAN BENZOATE 10 MG PO TBDP: 10.0000 mg | ORAL_TABLET | Freq: Once | ORAL | Status: AC | PRN

## 2016-03-04 NOTE — Progress Notes (Addendum)
[facility]    Provider:  Dr Jaynee Eagles Referring Provider: Jani Gravel MD Primary Care Physician:  Jani Gravel, MD  CC:  Visual changes  HPI:  Thomas Curtis is a 55 y.o. male here as a referral from Dr. Elease Hashimoto for blurry vision. Past medical history of obesity, anxiety, depression. He says he fired the last person who told him these are migraines and doesn't understand how we can have migraines with visual changes without a headache. Started 18 months ago. Bright lights set off the vision changes. When the light hits his eyes the symptoms occur. His vision becomes blurry, sees camera flashes and water running uphill. It lasts for 5 minutes or a few hours. He takes a migraine pill and it doesn't work. He feels like his eyes are full of sand. No headache. Patient denies any headaches (however previous clinic notes report patient has endorsed a headache with these episodes, see below). He gets a tension headache every once in a while, no migrainous features, otherwise denies headaches with the vision changes. He is getting more light sensitive recently. The symptoms are occurring more frequently. Now weekly lasts 5 minutes to a few hours. No numbness, tingling, weakness or other associated symptoms. He has been to an eye doctor and his eyes are "perfect", he doesn't even need glasses. Dad passed away at 61, his family is very healthy except for macular degeneration. No FHx of migraines. There is a lot of stress in his life currently. He supports many family members, it is stressful. He saw Dr. Pandora Leiter for an eye exam. Denies any weakness or other focal neurologic symptoms.  Discussed preventative medication the patient does not want to start anything daily.  Reviewed notes, labs and imaging from outside physicians, which showed: Patient was seen in December 2016 for intermittent bilateral blurred vision. He reported over the past 8-9 months 5-6 episodes. Episodes started fairly  suddenly and lasts anywhere from 30 minutes to 1 hour. The blurriness stress the peripheral vision and then generalizes. Never had total loss of vision, no diplopia,OCD speech changes or focal weakness. Episode followed by headache which is frequently front-row and retro-orbital and sometimes unilateral. No nausea or vomiting. He tries Advil which doesn't help. Headaches can last several hours but visual symptoms resolved. No triggers.    CT 02/28/2016 showed No acute intracranial abnormalities including mass lesion or mass effect, hydrocephalus, extra-axial fluid collection, midline shift, hemorrhage, or acute infarction, large ischemic events (personally reviewed images)    BMP performed in August 2016 was unremarkable except for creatinine 1.32 and GFR 59.74.  Review of Systems: Patient complains of symptoms per HPI as well as the following symptoms: Anxiety, no current chest pain or shortness of breath. Pertinent negatives per HPI. All others negative.   Social History   Social History  . Marital Status: Married    Spouse Name: Fraser Din  . Number of Children: 2  . Years of Education: 13   Occupational History  . 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 Smoking status: Current Every Day Smoker -- 1.00 packs/day for 35 years    Types: Cigarettes  . Smokeless tobacco: Not on file  . Alcohol Use: Yes     Comment: Does no abuse  . Drug Use: No  . Sexual Activity: Not on file   Other Topics Concern  . Not on file   Social History Narrative   Lives with spouse and mother   Caffeine use: 2-3  cups per day    Family History  Problem Relation Age of Onset  . Hypertension Mother   . Hypertension Father   . Cancer Father     colon, lung, prostate  . Migraines Neg Hx     Past Medical History  Diagnosis Date  . Chicken pox   . GERD (gastroesophageal reflux disease)   . Colon polyps 2010  . Anxiety   . Anginal pain ([place])   . Depression   . Headache     Past  Surgical History  Procedure Laterality Date  . None      Current Outpatient Prescriptions  Medication Sig Dispense Refill  . rizatriptan (MAXALT-MLT) 10 MG disintegrating tablet Take 1 tablet (10 mg total) by mouth once as needed for migraine. May repeat in 2 hours if needed 10 tablet 11  . SUMAtriptan (IMITREX) 100 MG tablet Take 1 tablet (100 mg total) by mouth every 2 (two) hours as needed for migraine. May repeat in 2 hours if headache persists or recurs. (Patient not taking: Reported on 03/04/2016) 10 tablet 5  . topiramate (TOPAMAX) 50 MG tablet Take 2 tablets (100 mg total) by mouth at bedtime. 60 tablet 11   No current facility-administered medications for this visit.    Allergies as of 03/04/2016  . (No Known Allergies)    Vitals: BP 144/82 mmHg  Pulse 67  Ht 5' 11.5" (1.816 m)  Wt 242 lb (109.77 kg)  BMI 33.29 kg/m2 Last Weight:  Wt Readings from Last 1 Encounters:  03/04/16 242 lb (109.77 kg)   Last Height:   Ht Readings from Last 1 Encounters:  03/04/16 5' 11.5" (1.816 m)    Physical exam: Exam: Gen: NAD, conversant, well nourised, obese, well groomed                     CV: RRR, no MRG. No Carotid Bruits. No peripheral edema, warm, nontender Eyes: Conjunctivae clear without exudates or hemorrhage  Neuro: Detailed Neurologic Exam  Speech:    Speech is normal; fluent and spontaneous with normal comprehension.  Cognition:    The patient is oriented to person, place, and time;     recent and remote memory intact;     language fluent;     normal attention, concentration,     fund of knowledge Cranial Nerves:    The pupils are equal, round, and reactive to light. The fundi are normal and spontaneous venous pulsations are present. Visual fields are full to finger confrontation. Extraocular movements are intact. Trigeminal sensation is intact and the muscles of mastication are normal. The face is symmetric. The palate elevates in the midline. Hearing intact.  Voice is normal. Shoulder shrug is normal. The tongue has normal motion without fasciculations.   Coordination:    Normal finger to nose and heel to shin. Normal rapid alternating movements.   Gait:    Heel-toe and tandem gait are normal.   Motor Observation:    No asymmetry, no atrophy, and no involuntary movements noted. Tone:    Normal muscle tone.    Posture:    Posture is normal. normal erect    Strength:    Strength is V/V in the upper and lower limbs.      Sensation: intact to LT     Reflex Exam:  DTR's:    Deep tendon reflexes in the upper and lower extremities are normal bilaterally.   Toes:    The toes are downgoing bilaterally.   Clonus:  Clonus is absent.      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 Patient has significant visual changes. Today he denies headache associated with the vision changes however there is excellent documentation in the past where patient details headache following the aura.   Discussed daily preventative medication and acute management of migraines. Discussed ocular migraines and migraine auras with patient. You also can have a migraine aura without the headache.  As far as your medications are concerned, I would like to suggest: Had a long discussion with patient, discussed starting Topiramate very slowly, discussed side effects as per patient instructions and below, stop for anything concerning. Reviewed slowly titrating as below, do not take full dose to start. Goal dose will be 100mg  at night and this will be on the prescription bottle but we will start slowly. Used the "teach back" method, had patient repeat back to me the instructions on proper dosing and how to titrate and to stop if side effects especially confusion, memory changes, somnolence.  Topiramate 1st week 1/2 tab Second week whole tab Third week 11/2 tabs Fourth week and ongoing 2 tabs at night for a goal dose of 100 mg daily at bedtime.  At onset,  maxalt under the tongue. May repeat in 2 hours if needed. Discussed the patient needs to do this right at the onset of headache. The most common side-effects are feeling sick (nausea), dizziness and dry mouth. In addition, triptans can also cause some people to experience strange sensations. These may be a tightness, tingling, flushing, and feelings of heaviness or pressure in areas such as the face and limbs, and occasionally the chest. Serious side effects can include stroke, cardiac side effects such as chest tightness, shortness of breath and possible cardiovascular adverse effects. This is not a complete list, see patien tinstructions, makes sure to read through and call for any side effects or concerns.     Discussed medication side effects as detailed in the patient instructions.  To prevent or relieve headaches, try the following: Cool Compress. Lie down and place a cool compress on your head.  Avoid headache triggers. If certain foods or odors seem to have triggered your migraines in the past, avoid them. A headache diary might help you identify triggers.  Include physical activity in your daily routine. Try a daily walk or other moderate aerobic exercise.  Manage stress. Find healthy ways to cope with the stressors, such as delegating tasks on your to-do list.  Practice relaxation techniques. Try deep breathing, yoga, massage and visualization.  Eat regularly. Eating regularly scheduled meals and maintaining a healthy diet might help prevent headaches. Also, drink plenty of fluids.  Follow a regular sleep schedule. Sleep deprivation might contribute to headaches Consider biofeedback. With this mind-body technique, you learn to control certain bodily functions - such as muscle tension, heart rate and blood pressure - to prevent headaches or reduce headache pain.    Proceed to emergency room if you experience new or worsening symptoms or symptoms do not resolve, if you have new neurologic  symptoms or if headache is severe, or for any concerning symptom.   Discussed side effects Topiramate. Serious side effects can inclue: Abdominal or stomach pain ,fever, chills, or sore throat , lessening of sensations or perception ,loss of appetite , mood or mental changes, including aggression, agitation, apathy, irritability, and mental depression , red, irritated, or bleeding gums , weight loss. Rare Blood in the urine , decrease in sexual performance or desire , difficult  or painful urination , frequent urination , hearing loss , loss of bladder control , lower back or side pain , nosebleeds , pale skin , red or irritated eyes , ringing or buzzing in the ears , skin rash or itching , swelling , trouble breathing, Common side effects of Topamax include:  tiredness, drowsiness, dizziness, nervousness, numbness or tingly feeling, coordination problems, diarrhea, weight loss, speech/language problems, changes in vision, sensory distortion, loss of appetite, bad taste in your mouth, confusion, slowed thinking, trouble concentrating or paying attention, memory problems. This is not a complete list, see patien tinstructions, makes sure to read through and call for any side effects or concerns.      Sarina Ill, MD  [facility] [address] [place], [place] [address]  Phone [phone] Fax [phone]

## 2016-03-04 NOTE — Patient Instructions (Addendum)
Remember to drink plenty of fluid, eat healthy meals and do not skip any meals. Try to eat protein with a every meal and eat a healthy snack such as fruit or nuts in between meals. Try to keep a regular sleep-wake schedule and try to exercise daily, particularly in the form of walking, 20-30 minutes a day, if you can.   As far as your medications are concerned, I would like to suggest:  Topiramate 1st week 1/2 tab Second week whole tab Third week 11/2 tabs Fourth week and ongoing 2 tabs at night  At onset, maxalt under the tongue. May repeat in 2 hours if needed.   Our phone number is [phone]. We also have an after hours call service for urgent matters and there is a physician on-call for urgent questions. For any emergencies you know to call 911 or go to the nearest emergency room  Rizatriptan disintegrating tablets What is this medicine? RIZATRIPTAN (rye za TRIP tan) is used to treat migraines with or without aura. An aura is a strange feeling or visual disturbance that warns you of an attack. It is not used to prevent migraines. This medicine may be used for other purposes; ask your health care provider or pharmacist if you have questions. What should I tell my health care provider before I take this medicine? They need to know if you have any of these conditions: -bowel disease or colitis -diabetes -family history of heart disease -fast or irregular heart beat -heart or blood vessel disease, angina (chest pain), or previous heart attack -high blood pressure -high cholesterol -history of stroke, transient ischemic attacks (TIAs or mini-strokes), or intracranial bleeding -kidney or liver disease -overweight -poor circulation -postmenopausal or surgical removal of uterus and ovaries -an unusual or allergic reaction to rizatriptan, other medicines, foods, dyes, or preservatives -pregnant or trying to get pregnant -breast-feeding How should I use this medicine? Take this  medicine by mouth. Follow the directions on the prescription label. This medicine is taken at the first symptoms of a migraine. It is not for everyday use. Leave the tablet in the foil package until you are ready to take it. Do not push the tablet through the blister pack. Peel open the blister pack with dry hands and place the tablet on your tongue. The tablet will dissolve rapidly and be swallowed in your saliva. It is not necessary to drink any water to take this medicine. If your migraine headache returns after one dose, you can take another dose as directed. You must leave at least 2 hours between doses, and do not take more than 30 mg total in 24 hours. If there is no improvement at all after the first dose, do not take a second dose without talking to your doctor or health care professional. Do not take your medicine more often than directed. Talk to your pediatrician regarding the use of this medicine in children. While this drug may be prescribed for children as young as 6 years for selected conditions, precautions do apply. Overdosage: If you think you have taken too much of this medicine contact a poison control center or emergency room at once. NOTE: This medicine is only for you. Do not share this medicine with others. What if I miss a dose? This does not apply; this medicine is not for regular use. What may interact with this medicine? Do not take this medicine with any of the following medicines: -amphetamine, dextroamphetamine or cocaine -dihydroergotamine, ergotamine, ergoloid mesylates, methysergide, or ergot-type medication -  do not take within 24 hours of taking rizatriptan -feverfew -MAOIs like Carbex, Eldepryl, Marplan, Nardil, and Parnate - do not take rizatriptan within 2 weeks of stopping MAOI therapy. -other migraine medicines like almotriptan, eletriptan, naratriptan, sumatriptan, zolmitriptan - do not take within 24 hours of taking rizatriptan -tryptophan This medicine may  also interact with the following medications: -medicines for mental depression, anxiety or mood problems -propranolol This list may not describe all possible interactions. Give your health care provider a list of all the medicines, herbs, non-prescription drugs, or dietary supplements you use. Also tell them if you smoke, drink alcohol, or use illegal drugs. Some items may interact with your medicine. What should I watch for while using this medicine? Only take this medicine for a migraine headache. Take it if you get warning symptoms or at the start of a migraine attack. It is not for regular use to prevent migraine attacks. You may get drowsy or dizzy. Do not drive, use machinery, or do anything that needs mental alertness until you know how this medicine affects you. To reduce dizzy or fainting spells, do not sit or stand up quickly, especially if you are an older patient. Alcohol can increase drowsiness, dizziness and flushing. Avoid alcoholic drinks. Smoking cigarettes may increase the risk of heart-related side effects from using this medicine. If you take migraine medicines for 10 or more days a month, your migraines may get worse. Keep a diary of headache days and medicine use. Contact your healthcare professional if your migraine attacks occur more frequently. What side effects may I notice from receiving this medicine? Side effects that you should report to your doctor or health care professional as soon as possible: -allergic reactions like skin rash, itching or hives, swelling of the face, lips, or tongue -fast, slow, or irregular heart beat -increased or decreased blood pressure -seizures -severe stomach pain and cramping, bloody diarrhea -signs and symptoms of a blood clot such as breathing problems; changes in vision; chest pain; severe, sudden headache; pain, swelling, warmth in the leg; trouble speaking; sudden numbness or weakness of the face, arm or leg -tingling, pain, or  numbness in the face, hands, or feet Side effects that usually do not require medical attention (report to your doctor or health care professional if they continue or are bothersome): -drowsiness -dry mouth -feeling warm, flushing, or redness of the face -headache -muscle cramps, pain -nausea, vomiting -unusually weak or tired This list may not describe all possible side effects. Call your doctor for medical advice about side effects. You may report side effects to FDA at 1-800-FDA-1088. Where should I keep my medicine? Keep out of the reach of children. Store at room temperature between 15 and 30 degrees C (59 and 86 degrees F). Protect from light and moisture. Throw away any unused medicine after the expiration date. NOTE: This sheet is a summary. It may not cover all possible information. If you have questions about this medicine, talk to your doctor, pharmacist, or health care provider.    2016, [employer]. (2013-05-02 10:17:42)  Topiramate tablets What is this medicine? TOPIRAMATE (toe PYRE a mate) is used to treat seizures in adults or children with epilepsy. It is also used for the prevention of migraine headaches. This medicine may be used for other purposes; ask your health care provider or pharmacist if you have questions. What should I tell my health care provider before I take this medicine? They need to know if you have any of these conditions: -bleeding  disorders -cirrhosis of the liver or liver disease -diarrhea -glaucoma -kidney stones or kidney disease -low blood counts, like low white cell, platelet, or red cell counts -lung disease like asthma, obstructive pulmonary disease, emphysema -metabolic acidosis -on a ketogenic diet -schedule for surgery or a procedure -suicidal thoughts, plans, or attempt; a previous suicide attempt by you or a family member -an unusual or allergic reaction to topiramate, other medicines, foods, dyes, or  preservatives -pregnant or trying to get pregnant -breast-feeding How should I use this medicine? Take this medicine by mouth with a glass of water. Follow the directions on the prescription label. Do not crush or chew. You may take this medicine with meals. Take your medicine at regular intervals. Do not take it more often than directed. Talk to your pediatrician regarding the use of this medicine in children. Special care may be needed. While this drug may be prescribed for children as young as 66 years of age for selected conditions, precautions do apply. Overdosage: If you think you have taken too much of this medicine contact a poison control center or emergency room at once. NOTE: This medicine is only for you. Do not share this medicine with others. What if I miss a dose? If you miss a dose, take it as soon as you can. If your next dose is to be taken in less than 6 hours, then do not take the missed dose. Take the next dose at your regular time. Do not take double or extra doses. What may interact with this medicine? Do not take this medicine with any of the following medications: -probenecid This medicine may also interact with the following medications: -acetazolamide -alcohol -amitriptyline -aspirin and aspirin-like medicines -birth control pills -certain medicines for depression -certain medicines for seizures -certain medicines that treat or prevent blood clots like warfarin, enoxaparin, dalteparin, apixaban, dabigatran, and rivaroxaban -digoxin -hydrochlorothiazide -lithium -medicines for pain, sleep, or muscle relaxation -metformin -methazolamide -NSAIDS, medicines for pain and inflammation, like ibuprofen or naproxen -pioglitazone -risperidone This list may not describe all possible interactions. Give your health care provider a list of all the medicines, herbs, non-prescription drugs, or dietary supplements you use. Also tell them if you smoke, drink alcohol, or use  illegal drugs. Some items may interact with your medicine. What should I watch for while using this medicine? Visit your doctor or health care professional for regular checks on your progress. Do not stop taking this medicine suddenly. This increases the risk of seizures if you are using this medicine to control epilepsy. Wear a medical identification bracelet or chain to say you have epilepsy or seizures, and carry a card that lists all your medicines. This medicine can decrease sweating and increase your body temperature. Watch for signs of deceased sweating or fever, especially in children. Avoid extreme heat, hot baths, and saunas. Be careful about exercising, especially in hot weather. Contact your health care provider right away if you notice a fever or decrease in sweating. You should drink plenty of fluids while taking this medicine. If you have had kidney stones in the past, this will help to reduce your chances of forming kidney stones. If you have stomach pain, with nausea or vomiting and yellowing of your eyes or skin, call your doctor immediately. You may get drowsy, dizzy, or have blurred vision. Do not drive, use machinery, or do anything that needs mental alertness until you know how this medicine affects you. To reduce dizziness, do not sit or stand up quickly, especially  if you are an older patient. Alcohol can increase drowsiness and dizziness. Avoid alcoholic drinks. If you notice blurred vision, eye pain, or other eye problems, seek medical attention at once for an eye exam. The use of this medicine may increase the chance of suicidal thoughts or actions. Pay special attention to how you are responding while on this medicine. Any worsening of mood, or thoughts of suicide or dying should be reported to your health care professional right away. This medicine may increase the chance of developing metabolic acidosis. If left untreated, this can cause kidney stones, bone disease, or slowed  growth in children. Symptoms include breathing fast, fatigue, loss of appetite, irregular heartbeat, or loss of consciousness. Call your doctor immediately if you experience any of these side effects. Also, tell your doctor about any surgery you plan on having while taking this medicine since this may increase your risk for metabolic acidosis. Birth control pills may not work properly while you are taking this medicine. Talk to your doctor about using an extra method of birth control. Women who become pregnant while using this medicine may enroll in the [place] Pregnancy Registry by calling [phone]. This registry collects information about the safety of antiepileptic drug use during pregnancy. What side effects may I notice from receiving this medicine? Side effects that you should report to your doctor or health care professional as soon as possible: -allergic reactions like skin rash, itching or hives, swelling of the face, lips, or tongue -decreased sweating and/or rise in body temperature -depression -difficulty breathing, fast or irregular breathing patterns -difficulty speaking -difficulty walking or controlling muscle movements -hearing impairment -redness, blistering, peeling or loosening of the skin, including inside the mouth -tingling, pain or numbness in the hands or feet -unusual bleeding or bruising -unusually weak or tired -worsening of mood, thoughts or actions of suicide or dying Side effects that usually do not require medical attention (report to your doctor or health care professional if they continue or are bothersome): -altered taste -back pain, joint or muscle aches and pains -diarrhea, or constipation -headache -loss of appetite -nausea -stomach upset, indigestion -tremors This list may not describe all possible side effects. Call your doctor for medical advice about side effects. You may report side effects to FDA at  1-800-FDA-1088. Where should I keep my medicine? Keep out of the reach of children. Store at room temperature between 15 and 30 degrees C (59 and 86 degrees F) in a tightly closed container. Protect from moisture. Throw away any unused medicine after the expiration date. NOTE: This sheet is a summary. It may not cover all possible information. If you have questions about this medicine, talk to your doctor, pharmacist, or health care provider.    2016, [employer]. (2013-09-04 23:17:57)

## 2016-03-05 ENCOUNTER — Encounter: Payer: Self-pay | Admitting: Neurology

## 2016-03-05 DIAGNOSIS — G43101 Migraine with aura, not intractable, with status migrainosus: Secondary | ICD-10-CM | POA: Insufficient documentation

## 2016-03-08 ENCOUNTER — Encounter (HOSPITAL_COMMUNITY): Payer: Self-pay | Admitting: Emergency Medicine

## 2016-03-08 ENCOUNTER — Other Ambulatory Visit: Payer: Self-pay

## 2016-03-08 ENCOUNTER — Emergency Department (HOSPITAL_COMMUNITY)
Admission: EM | Admit: 2016-03-08 | Discharge: 2016-03-08 | Disposition: A | Discharge status: Home / Self Care | Payer: BC Managed Care – PPO | Attending: Emergency Medicine | Admitting: Emergency Medicine

## 2016-03-08 DIAGNOSIS — T50905A Adverse effect of unspecified drugs, medicaments and biological substances, initial encounter: Secondary | ICD-10-CM

## 2016-03-08 DIAGNOSIS — R51 Headache: Secondary | ICD-10-CM | POA: Diagnosis not present

## 2016-03-08 DIAGNOSIS — F410 Panic disorder [episodic paroxysmal anxiety] without agoraphobia: Secondary | ICD-10-CM | POA: Diagnosis not present

## 2016-03-08 DIAGNOSIS — F329 Major depressive disorder, single episode, unspecified: Secondary | ICD-10-CM | POA: Diagnosis not present

## 2016-03-08 DIAGNOSIS — Z79899 Other long term (current) drug therapy: Secondary | ICD-10-CM | POA: Diagnosis not present

## 2016-03-08 DIAGNOSIS — F17210 Nicotine dependence, cigarettes, uncomplicated: Secondary | ICD-10-CM | POA: Diagnosis not present

## 2016-03-08 DIAGNOSIS — R42 Dizziness and giddiness: Secondary | ICD-10-CM

## 2016-03-08 DIAGNOSIS — I10 Essential (primary) hypertension: Secondary | ICD-10-CM | POA: Diagnosis not present

## 2016-03-08 LAB — CBC WITH DIFFERENTIAL/PLATELET
BASOS ABS: 0 10*3/uL (ref 0.0–0.1)
BASOS PCT: 0 %
EOS ABS: 0 10*3/uL (ref 0.0–0.7)
EOS PCT: 0 %
HCT: 41.8 % (ref 39.0–52.0)
Hemoglobin: 14.2 g/dL (ref 13.0–17.0)
Lymphocytes Relative: 8 %
Lymphs Abs: 0.9 10*3/uL (ref 0.7–4.0)
MCH: 30.1 pg (ref 26.0–34.0)
MCHC: 34 g/dL (ref 30.0–36.0)
MCV: 88.6 fL (ref 78.0–100.0)
MONO ABS: 0.7 10*3/uL (ref 0.1–1.0)
Monocytes Relative: 6 %
Neutro Abs: 9.5 10*3/uL — ABNORMAL HIGH (ref 1.7–7.7)
Neutrophils Relative %: 86 %
PLATELETS: 239 10*3/uL (ref 150–400)
RBC: 4.72 MIL/uL (ref 4.22–5.81)
RDW: 13 % (ref 11.5–15.5)
WBC: 11.2 10*3/uL — AB (ref 4.0–10.5)

## 2016-03-08 LAB — BASIC METABOLIC PANEL
ANION GAP: 7 (ref 5–15)
BUN: 12 mg/dL (ref 6–20)
CALCIUM: 9.4 mg/dL (ref 8.9–10.3)
CO2: 23 mmol/L (ref 22–32)
Chloride: 106 mmol/L (ref 101–111)
Creatinine, Ser: 1.2 mg/dL (ref 0.61–1.24)
GFR calc Af Amer: >60 mL/min (ref >60)
GLUCOSE: 111 mg/dL — AB (ref 65–99)
POTASSIUM: 4.5 mmol/L (ref 3.5–5.1)
SODIUM: 136 mmol/L (ref 135–145)

## 2016-03-08 LAB — I-STAT TROPONIN, ED: TROPONIN I, POC: 0 ng/mL (ref 0.00–0.08)

## 2016-03-08 MED ORDER — SODIUM CHLORIDE 0.9 % IV BOLUS (SEPSIS)
1000.0000 mL | Freq: Once | INTRAVENOUS | Status: AC
  Administered 2016-03-08: 1000 mL via INTRAVENOUS

## 2016-03-08 NOTE — ED Provider Notes (Signed)
CSN: [id number]     Arrival date & time 03/08/16  1107 History   First MD Initiated Contact with Patient 03/08/16 1134     Chief Complaint  Patient presents with  . Anxiety     (Consider location/radiation/quality/duration/timing/severity/associated sxs/prior Treatment) HPI Comments: Thomas Curtis is a 55 y.o. male with a PMHx of HTN, headaches/migraines, GERD, depression, anxiety, and anginal pain, who presents to the ED with complaints of lightheadedness that began approximately 3-4 hours ago when he had a panic attack after looking at his bank account and realizing that a large sum of money that was supposed be transferred into his account had not transferred. He states he felt nauseated but that resolved. He reports that he started taking Topamax last night around 10-11 PM prior to bedtime, but isn't sure if he took it correctly. This morning upon awakening he had ~4-5 cups of coffee, and this was during the time that he felt the panic attack. He went to the fire station and his blood pressure was very elevated, so he came to the ER for further evaluation. He states he feels overall improved, but still feels somewhat lightheaded. Of note, after realizing that his money transfer was still safely in his money market account, his symptoms did improve after that.  He denies any fevers, chills, headache, vision changes, syncope, vertigo, chest pain, shortness of breath, abdominal pain, and ongoing nausea, vomiting, diarrhea, constipation, dysuria, hematuria, numbness, tingling, or focal weakness. Additionally, nursing note reported that he drank 2 beers last night, but he reports that he was mistaken, and that he did not drink 2 beers last night, states that this was several nights prior, and that he did not take any alcohol trying his new medication.  Patient is a 55 y.o. male presenting with anxiety and dizziness. The history is provided by the patient and medical records. No language  interpreter was used.  Anxiety Pertinent negatives include no abdominal pain, arthralgias, chest pain, chills, fever, headaches, myalgias, nausea (resolved), numbness, visual change, vomiting or weakness.  Dizziness Quality:  Lightheadedness Severity:  Mild Onset quality:  Gradual Duration:  3 hours Timing:  Sporadic Progression:  Partially resolved Chronicity:  New Context: medication   Relieved by:  None tried Worsened by:  Nothing Ineffective treatments:  None tried Associated symptoms: no chest pain, no diarrhea, no headaches, no nausea (resolved), no shortness of breath, no syncope, no vision changes, no vomiting and no weakness   Risk factors: new medications     Past Medical History  Diagnosis Date  . Chicken pox   . GERD (gastroesophageal reflux disease)   . Colon polyps 2010  . Anxiety   . Anginal pain ([place])   . Depression   . Headache    Past Surgical History  Procedure Laterality Date  . None     Family History  Problem Relation Age of Onset  . Hypertension Mother   . Hypertension Father   . Cancer Father     colon, lung, prostate  . Migraines Neg Hx    Social History  Substance Use Topics  . Smoking status: Current Every Day Smoker -- 1.00 packs/day for 35 years    Types: Cigarettes  . Smokeless tobacco: None  . Alcohol Use: Yes     Comment: Does no abuse    Review of Systems  Constitutional: Negative for fever and chills.  Eyes: Negative for visual disturbance.  Respiratory: Negative for shortness of breath.   Cardiovascular: Negative for chest pain  and syncope.  Gastrointestinal: Negative for nausea (resolved), vomiting, abdominal pain, diarrhea and constipation.  Genitourinary: Negative for dysuria and hematuria.  Musculoskeletal: Negative for myalgias and arthralgias.  Skin: Negative for color change.  Allergic/Immunologic: Negative for immunocompromised state.  Neurological: Positive for dizziness and light-headedness. Negative for syncope,  weakness, numbness and headaches.  Psychiatric/Behavioral: Negative for confusion.   10 Systems reviewed and are negative for acute change except as noted in the HPI.    Allergies  Review of patient's allergies indicates no known allergies.  Home Medications   Prior to Admission medications   Medication Sig Start Date End Date Taking? Authorizing Provider  ibuprofen (ADVIL,MOTRIN) 400 MG tablet Take 400 mg by mouth every 6 (six) hours as needed for mild pain.   Yes Historical Provider, MD  omeprazole (PRILOSEC) 20 MG capsule Take 20 mg by mouth daily.   Yes Historical Provider, MD  rizatriptan (MAXALT-MLT) 10 MG disintegrating tablet Take 1 tablet (10 mg total) by mouth once as needed for migraine. May repeat in 2 hours if needed 03/04/16  Yes Melvenia Beam, MD  SUMAtriptan (IMITREX) 100 MG tablet Take 1 tablet (100 mg total) by mouth every 2 (two) hours as needed for migraine. May repeat in 2 hours if headache persists or recurs. 08/29/15  Yes Eulas Post, MD  topiramate (TOPAMAX) 50 MG tablet Take 2 tablets (100 mg total) by mouth at bedtime. 03/04/16  Yes Melvenia Beam, MD   BP 166/99 mmHg  Pulse 70  Temp(Src) 97.9 F (36.6 C) (Oral)  Resp 16  Ht 5' 11.5" (1.816 m)  Wt 106.142 kg  BMI 32.19 kg/m2  SpO2 100% Physical Exam  Constitutional: He is oriented to person, place, and time. Vital signs are normal. He appears well-developed and well-nourished.  Non-toxic appearance. No distress.  Afebrile, nontoxic, NAD, slightly elevated BP 160s/90s  HENT:  Head: Normocephalic and atraumatic.  Mouth/Throat: Oropharynx is clear and moist and mucous membranes are normal.  Eyes: Conjunctivae and EOM are normal. Pupils are equal, round, and reactive to light. Right eye exhibits no discharge. Left eye exhibits no discharge.  PERRL, EOMI, no nystagmus, no visual field deficits   Neck: Normal range of motion. Neck supple. No spinous process tenderness and no muscular tenderness present.  No rigidity. Normal range of motion present.  FROM intact without spinous process TTP, no bony stepoffs or deformities, no paraspinous muscle TTP or muscle spasms. No rigidity or meningeal signs. No bruising or swelling.   Cardiovascular: Normal rate, regular rhythm, normal heart sounds and intact distal pulses.  Exam reveals no gallop and no friction rub.   No murmur heard. Pulmonary/Chest: Effort normal and breath sounds normal. No respiratory distress. He has no decreased breath sounds. He has no wheezes. He has no rhonchi. He has no rales.  Abdominal: Soft. Normal appearance and bowel sounds are normal. He exhibits no distension. There is no tenderness. There is no rigidity, no rebound, no guarding, no CVA tenderness, no tenderness at McBurney's point and negative Murphy's sign.  Musculoskeletal: Normal range of motion.  MAE x4 Strength and sensation grossly intact Distal pulses intact Gait steady  Neurological: He is alert and oriented to person, place, and time. He has normal strength. No cranial nerve deficit or sensory deficit. Coordination and gait normal. GCS eye subscore is 4. GCS verbal subscore is 5. GCS motor subscore is 6.  CN 2-12 grossly intact A&O x4 GCS 15 Sensation and strength intact Gait nonataxic including with tandem walking  Coordination with finger-to-nose WNL Neg pronator drift   Skin: Skin is warm, dry and intact. No rash noted.  Psychiatric: He has a normal mood and affect.  Nursing note and vitals reviewed.  12:41 Orthostatic Vital Signs CG  Orthostatic Lying  - BP- Lying: 140/80 mmHg ; Pulse- Lying: 55  Orthostatic Sitting - BP- Sitting: 145/92 mmHg (light headed) ; Pulse- Sitting: 57  Orthostatic Standing at 0 minutes - BP- Standing at 0 minutes: 145/84 mmHg ; Pulse- Standing at 0 minutes: 58       ED Course  Procedures (including critical care time) Labs Review Labs Reviewed  CBC WITH DIFFERENTIAL/PLATELET - Abnormal; Notable for the following:     WBC 11.2 (*)    Neutro Abs 9.5 (*)    All other components within normal limits  BASIC METABOLIC PANEL - Abnormal; Notable for the following:    Glucose, Bld 111 (*)    All other components within normal limits  I-STAT TROPOININ, ED    Imaging Review No results found. I have personally reviewed and evaluated these images and lab results as part of my medical decision-making.   EKG Interpretation   Date/Time:  Sunday March 08 2016 12:40:26 EDT Ventricular Rate:  56 PR Interval:    QRS Duration: 110 QT Interval:  457 QTC Calculation: 442 R Axis:   61 Text Interpretation:  Sinus rhythm RSR' in V1 or V2, right VCD or RVH  Minimal ST elevation, anterior leads No significant change since last  tracing Confirmed by BEATON  MD, ROBERT ([id number]) on 03/08/2016 1:14:08 PM      MDM   Final diagnoses:  Episodic lightheadedness  Medication reaction, initial encounter  HTN (hypertension), benign  Panic attack    55 y.o. male here with lightheadedness that began this morning after he had a panic attack when a large sum of money didn't show up in his bank account as a transfer; felt nauseated but that resolved, just still feels lightheaded. Drank 4-5 cups of coffee during this episode, in addition to starting the topamax last night. Thinks he didn't take the topamax like he was supposed to. On exam, no focal neuro deficits. BP slightly elevated 160s/90 but likely from adrenaline surge response. Will get EKG, trop, labs, and orthostatic VS. Will reassess shortly. Doubt need for imaging.   1:41 PM  Trop neg. EKG nonischemic without acute changes. Orthostatics WNL although he reported lightheadedness with sitting. BMP WNL. CBC w/diff showing slight leukocytosis 11.2 but without abnormal differential therefore doubt significance.  Receiving fluids, will reassess after these are done. Likely med related with stress response from panic attack/coffee combined with dehydration. Discussed staying  hydrated, taking half of topamax rx tonight for several nights then titrating up to the prescribed amount. F/up with PCP in 1wk for recheck. Will reassess after fluids finish  2:07 PM Fluids half done, feels better, ready to go home. Will d/c home with instructions to stay hydrated, cut back on coffee, and decrease topamax to once tablet at bedtime titrating up to two tabs. F/up with PCP in 1wk. I explained the diagnosis and have given explicit precautions to return to the ER including for any other new or worsening symptoms. The patient understands and accepts the medical plan as it's been dictated and I have answered their questions. Discharge instructions concerning home care and prescriptions have been given. The patient is STABLE and is discharged to home in good condition.  BP 136/78 mmHg  Pulse 57  Temp(Src) 97.9 F (  36.6 C) (Oral)  Resp 13  Ht 5' 11.5" (1.816 m)  Wt 106.142 kg  BMI 32.19 kg/m2  SpO2 100%  Meds ordered this encounter  Medications  . sodium chloride 0.9 % bolus 1,000 mL    Sig:      Cletus Paris Camprubi-Soms, PA-C 03/08/16 1407  Leonard Schwartz, MD 03/08/16 1456

## 2016-03-08 NOTE — ED Notes (Signed)
Pt now states thT HE THINKS HE WAS SUPPOSED TO WORK UP TO 2 TOPAMAX INSTEAD OF JUSR TAKING  2 RIGHT OFF THE BAT.  States he was stressed

## 2016-03-08 NOTE — ED Notes (Addendum)
Pt arrives via [facility] c/oanxiety ,was placed on new migrain meds  that he started last night ( topamax x  2 at bedtime) and then drank some beers and ate then woke up this am at 4 am and checked his bank account and became concerned that $ he had transferred had not done so, he drank some coffee he became nauseated and dizzy he walked to fire station and they called ems after checking his bp

## 2016-03-08 NOTE — Discharge Instructions (Signed)
Your symptoms are likely from starting the new medication, as well as drinking lots of coffee and potentially getting a little dehydrated, as well as having a stressful event occurring. Stay well hydrated. Cut back on the coffee consumption, and consider cutting back on the Topamax to one tablet at bedtime for several days then increase to two tablets at bedtime as prescribed by your doctor. Follow up with your regular doctor in 1 week for recheck of symptoms. Return to the ER for changes or worsening symptoms.   Near-Syncope Near-syncope (commonly known as near fainting) is sudden weakness, dizziness, or feeling like you might pass out. This can happen when getting up or while standing for a long time. It is caused by a sudden decrease in blood flow to the brain, which can occur for various reasons. Most of the reasons are not serious.  HOME CARE Watch your condition for any changes.  Have someone stay with you until you feel stable.  If you feel like you are going to pass out:  Lie down right away.  Prop your feet up if you can.  Breathe deeply and steadily.  Move only when the feeling has gone away. Most of the time, this feeling lasts only a few minutes. You may feel tired for several hours.  Drink enough fluids to keep your pee (urine) clear or pale yellow.  If you are taking blood pressure or heart medicine, stand up slowly.  Follow up with your doctor as told. GET HELP RIGHT AWAY IF:   You have a severe headache.  You have unusual pain in the chest, belly (abdomen), or back.  You have bleeding from the mouth or butt (rectum), or you have black or tarry poop (stool).  You feel your heart beat differently than normal, or you have a very fast pulse.  You pass out, or you twitch and shake when you pass out.  You pass out when sitting or lying down.  You feel confused.  You have trouble walking.  You are weak.  You have vision problems. MAKE SURE YOU:   Understand  these instructions.  Will watch your condition.  Will get help right away if you are not doing well or get worse.   This information is not intended to replace advice given to you by your health care provider. Make sure you discuss any questions you have with your health care provider.   Document Released: 02/17/2008 Document Revised: 09/21/2014 Document Reviewed: 02/03/2013 Elsevier Interactive Patient Education 2016 [employer]  Panic Attacks Panic attacks are sudden, short feelings of great fear or discomfort. You may have them for no reason when you are relaxed, when you are uneasy (anxious), or when you are sleeping.  HOME CARE  Take all your medicines as told.  Check with your doctor before starting new medicines.  Keep all doctor visits. GET HELP IF:  You are not able to take your medicines as told.  Your symptoms do not get better.  Your symptoms get worse. GET HELP RIGHT AWAY IF:  Your attacks seem different than your normal attacks.  You have thoughts about hurting yourself or others.  You take panic attack medicine and you have a side effect. MAKE SURE YOU:  Understand these instructions.  Will watch your condition.  Will get help right away if you are not doing well or get worse.   This information is not intended to replace advice given to you by your health care provider. Make sure you  discuss any questions you have with your health care provider.   Document Released: 10/03/2010 Document Revised: 06/21/2013 Document Reviewed: 04/14/2013 Elsevier Interactive Patient Education [employer].

## 2016-03-09 ENCOUNTER — Telehealth: Payer: Self-pay | Admitting: Neurology

## 2016-03-09 NOTE — Telephone Encounter (Signed)
Dr Ahern- please advise 

## 2016-03-09 NOTE — Telephone Encounter (Signed)
Pt's wife called and says that pt went to the hospital yesterday due to medication reaction , topiramate (TOPAMAX) 50 MG tablet. B/P was 187/100. Please call and advise [phone]

## 2016-03-09 NOTE — Telephone Encounter (Signed)
Tried calling pt wife back. Got busy signal. Will try again later.

## 2016-03-09 NOTE — Telephone Encounter (Signed)
Thomas Curtis, would you call and talk to patient. Sounds like he did not take the medication correctly. He was supposed to start with 1/2 a pill at night and not take 2 whole pills of the topiramate the first night. Looked at my directions from the patient AVS of that visit. Start with 1/2 pill and slowly increase. He is welcome to try it again and start at 1/2 of a pill and stay on that dose at night for at least 2 weeks to make sure he has no side effects. His high blood pressure sounds like it was in reaction to a panic reaction, he looked at his bank account and saw he did not get a deposit per ED notes. nyway would you call and discuss wit him thanks.

## 2016-03-09 NOTE — Telephone Encounter (Signed)
Dr Jaynee Eagles- FYI Called and spoke to wife, Mardene Celeste. Relayed Dr Cathren Laine message below. I had wife write down directions per Dr Jaynee Eagles AVS instructions: Topiramate: 1st week 1/2 tab, Second week whole tab Third week 11/2 tabs, Fourth week and ongoing 2 tabs at night. Wife stated pt was confused because he was going off of what the medication bottle said, not the instructions Dr Jaynee Eagles gave him. She states pt now has a "huge" hospital bill that he feels he should not pay for. Advised that Dr Jaynee Eagles stated he is welcome to try medication again. Told her to call with any further questions or concerns. She verbalized understanding.

## 2016-03-10 NOTE — Telephone Encounter (Addendum)
Rn call patients wife about her husband having a hard time taking the topiramate. Rn stated Dr. Jaynee Eagles was notified and requested he stop taking the medication. Rn stated per Dr. Jaynee Eagles she will call them back to discuss other meds for his headaches.Pts wife verbalized understanding.

## 2016-03-10 NOTE — Telephone Encounter (Addendum)
Pt's wife called and says pt can not take topiramate. He is having a really hard time. Is there anything else he can take. Please call may leave message

## 2016-03-11 NOTE — Telephone Encounter (Signed)
Left a message, if they are interested in trying something else please call back or email preferrably thanks

## 2016-08-30 ENCOUNTER — Other Ambulatory Visit: Payer: Self-pay | Admitting: Family Medicine

## 2016-09-02 ENCOUNTER — Other Ambulatory Visit: Payer: Self-pay | Admitting: Family Medicine

## 2016-09-02 NOTE — Telephone Encounter (Signed)
Patient was last seen 10/04/2015. Needs appointment for further refills.

## 2016-09-03 ENCOUNTER — Other Ambulatory Visit: Payer: Self-pay | Admitting: Emergency Medicine

## 2017-07-15 ENCOUNTER — Other Ambulatory Visit: Payer: Self-pay | Admitting: Family Medicine

## 2021-09-20 ENCOUNTER — Other Ambulatory Visit: Payer: Self-pay | Admitting: Family Medicine

## 2021-09-20 DIAGNOSIS — M5410 Radiculopathy, site unspecified: Secondary | ICD-10-CM

## 2021-09-20 DIAGNOSIS — M4316 Spondylolisthesis, lumbar region: Secondary | ICD-10-CM

## 2021-10-31 ENCOUNTER — Ambulatory Visit
Admission: RE | Admit: 2021-10-31 | Discharge: 2021-10-31 | Disposition: A | Discharge status: Home / Self Care | Payer: BC Managed Care – PPO | Source: Ambulatory Visit | Attending: Family Medicine | Admitting: Family Medicine

## 2021-10-31 DIAGNOSIS — M4316 Spondylolisthesis, lumbar region: Secondary | ICD-10-CM

## 2021-10-31 DIAGNOSIS — M5410 Radiculopathy, site unspecified: Secondary | ICD-10-CM

## 2022-10-12 IMAGING — MR MR LUMBAR SPINE W/O CM
4 of 5 series · 18 of 48 positions shown · non-contrast
Comparison: None.

CLINICAL DATA: Radiculopathy of leg; spondylolisthesis of lumbar
region

EXAM:
MRI LUMBAR SPINE WITHOUT CONTRAST
TECHNIQUE: Multiplanar, multisequence MR imaging of the lumbar spine was
performed. No intravenous contrast was administered.

[Series 5: T2 · sagittal · 4.0mm · 0.73mm/px · 6 of 17 slices shown (1 of 2)]
[im 1/17]
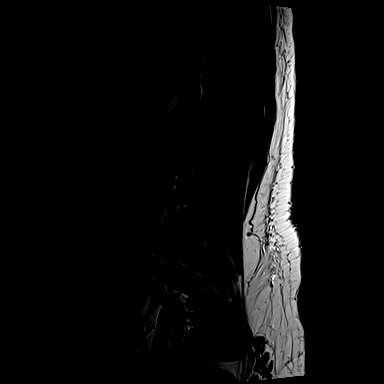
[im 4/17]
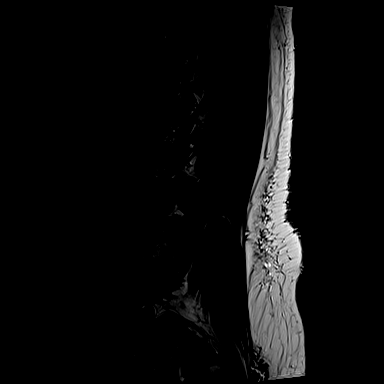
[im 7/17]
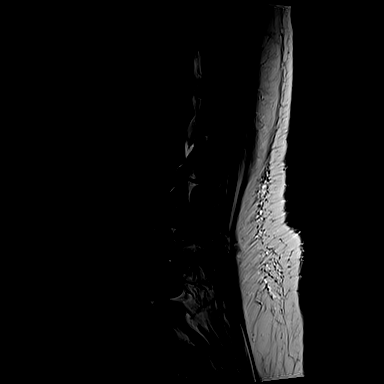
[im 10/17]
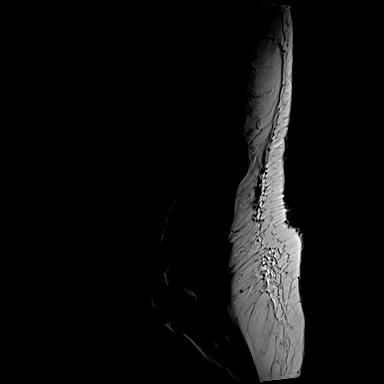
[im 13/17]
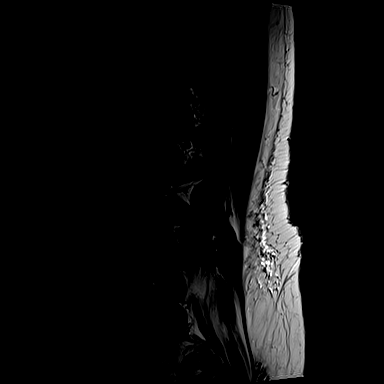
[im 17/17]
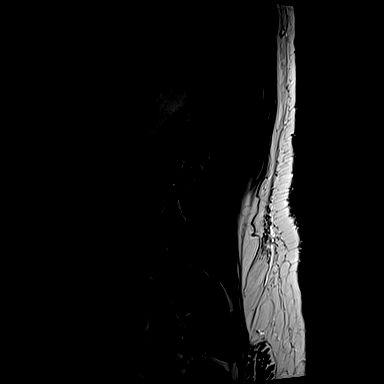

[Series 6: T1 · sagittal · 4.0mm · 0.73mm/px · 3 of 17 slices shown (1 of 2)]
[im 4/17]
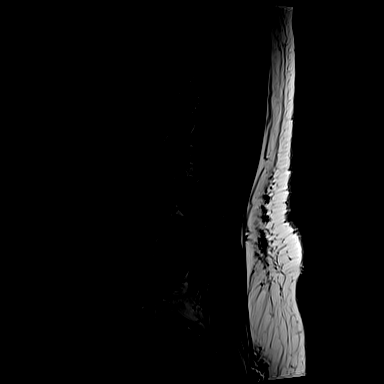
[im 10/17]
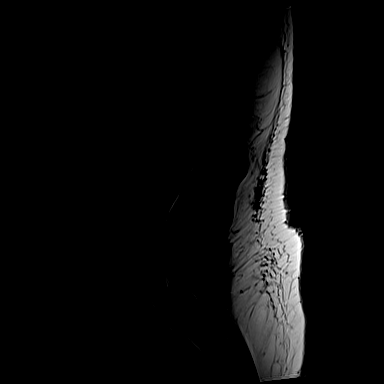
[im 17/17]
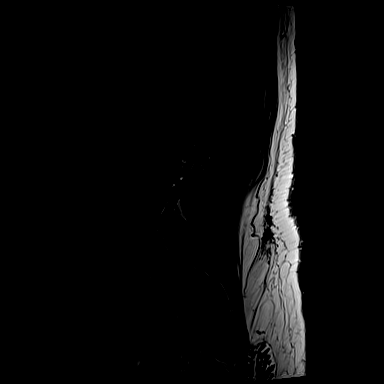

[Series 10: T1 · axial · 4.0mm · 0.28mm/px · z∈[-102,+72]mm · 3 of 46 slices shown (2 of 2)]
[im 7/46]
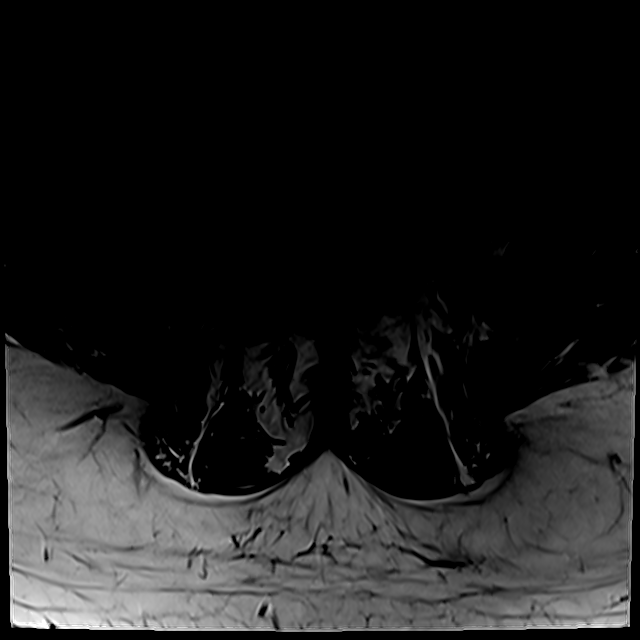
[im 23/46]
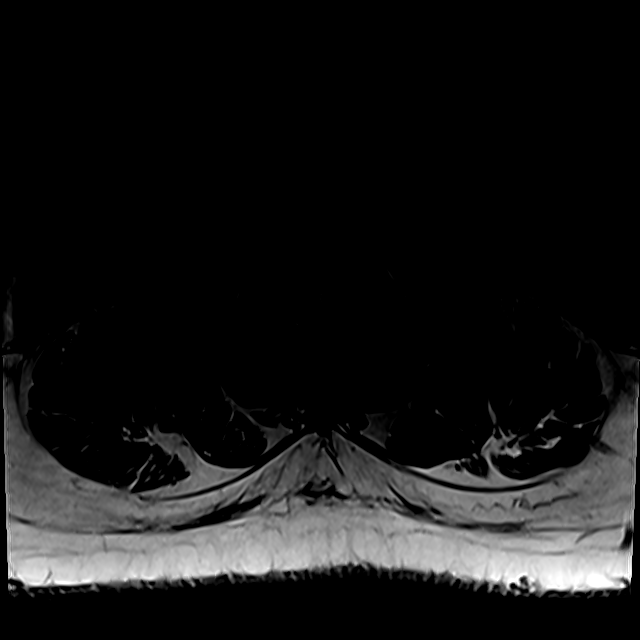
[im 39/46]
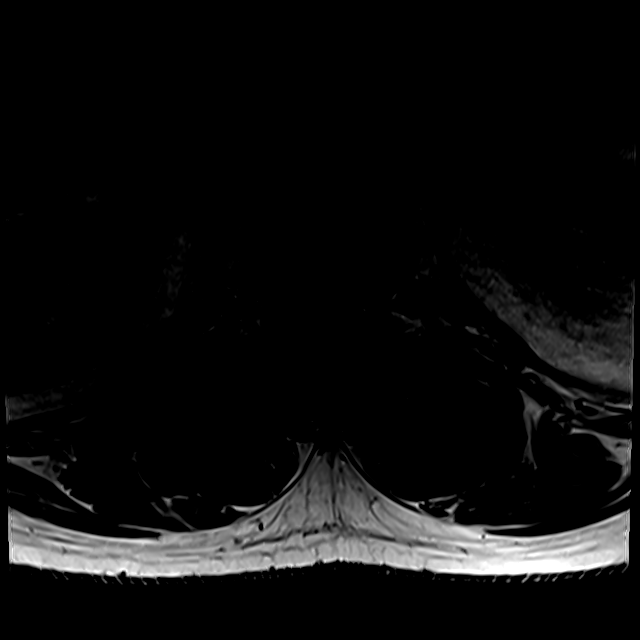

[Series 13: T2 · axial · 4.0mm · 0.28mm/px · z∈[-131,+72]mm · 6 of 46 slices shown (2 of 2)]
[im 1/46]
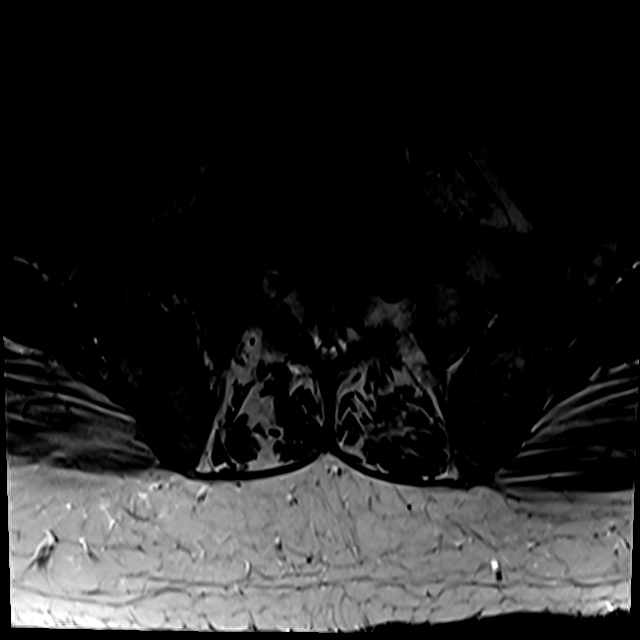
[im 7/46]
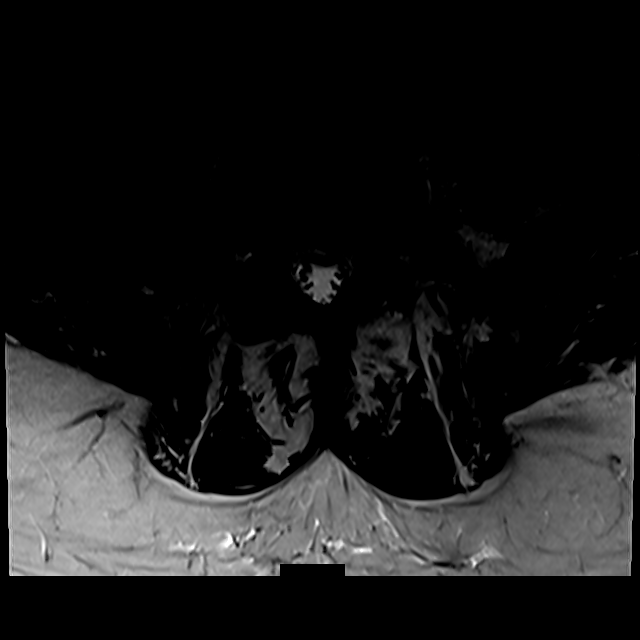
[im 13/46]
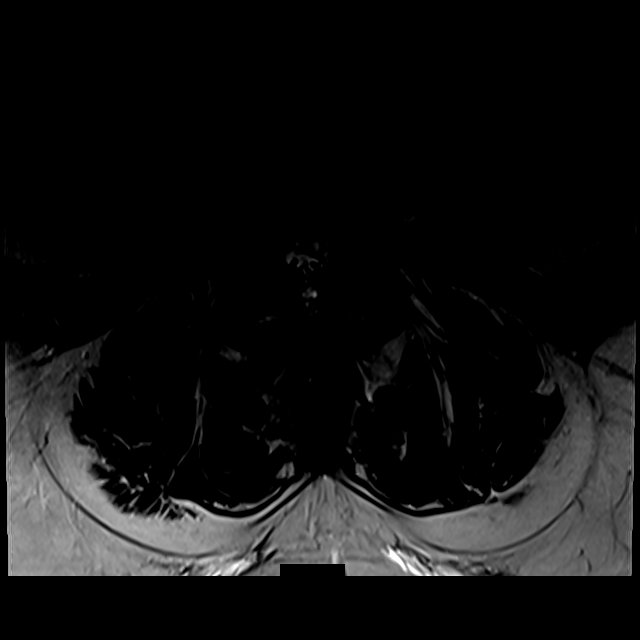
[im 20/46]
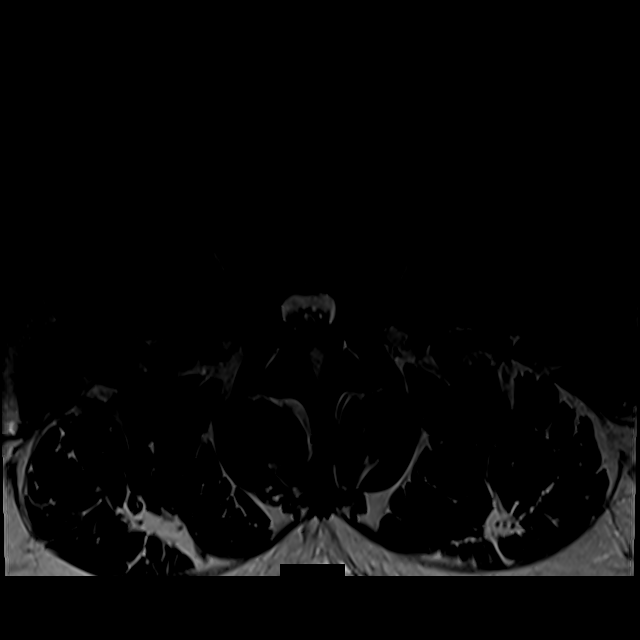
[im 23/46]
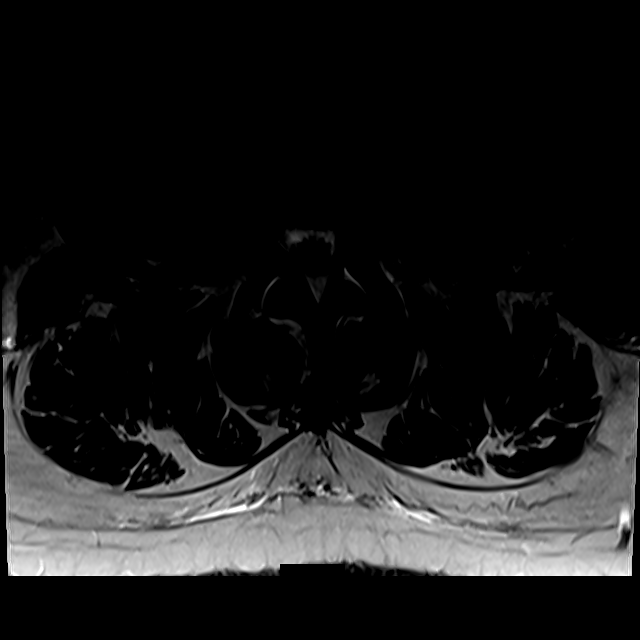
[im 39/46]
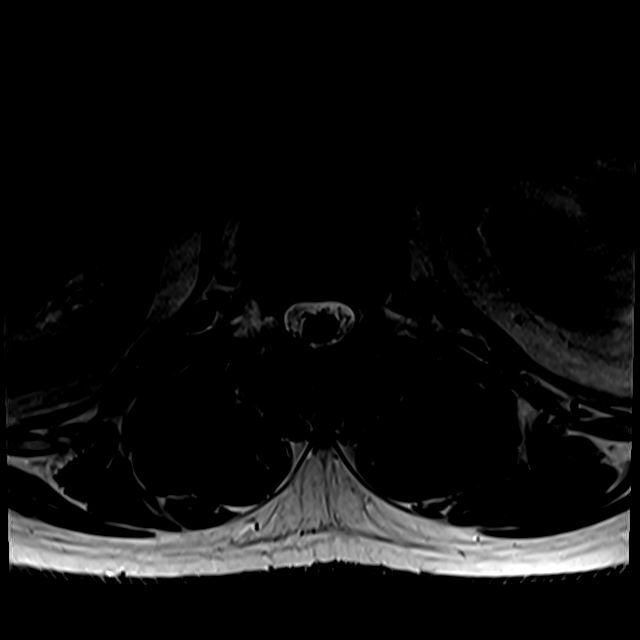

[18 of 48 positions shown; findings below may reference images not displayed]

FINDINGS: Segmentation:  Standard.

Alignment: Retrolisthesis at L1-L2 and L2-L3. Anterolisthesis at
L4-L5.

Vertebrae: Vertebral body heights are maintained. Probable chronic
bilateral L4 pars breaks. Decreased T1 marrow signal may reflect
hematopoietic marrow. No substantial marrow edema. No focal
suspicious osseous lesion.

Conus medullaris and cauda equina: Conus extends to the L1 level.
Conus and cauda equina appear normal.

Paraspinal and other soft tissues: Unremarkable.

Disc levels:

T12-L1: Broad-based central protrusion. Minor canal stenosis. No
foraminal stenosis.

L1-L2: Disc bulge. Facet arthropathy with ligamentum flavum
infolding. Minor canal stenosis. Mild right and minor left foraminal
stenosis.

L2-L3: Disc bulge. Facet arthropathy with ligamentum flavum
infolding. Mild to moderate canal stenosis. Mild to moderate
foraminal stenosis.

L3-L4: Disc bulge. Facet arthropathy with ligamentum flavum
infolding. Mild canal stenosis. Mild foraminal stenosis.

L4-L5: Anterolisthesis secondary to probable chronic bilateral pars
breaks with uncovering of disc bulge. Facet arthropathy with
ligamentum flavum infolding. Mild canal stenosis. Moderate right and
marked left foraminal stenosis.

L5-S1: Disc bulge. Facet arthropathy. No canal stenosis. Partial
effacement subarticular recesses. Moderate right and mild to
moderate left foraminal stenosis.
IMPRESSION: Multilevel degenerative changes as detailed above. No high-grade
canal stenosis. Foraminal stenosis is greatest on the left at L4-L5.

## 2023-08-30 ENCOUNTER — Other Ambulatory Visit: Payer: Self-pay

## 2023-08-30 ENCOUNTER — Other Ambulatory Visit (INDEPENDENT_AMBULATORY_CARE_PROVIDER_SITE_OTHER): Payer: Self-pay

## 2023-08-30 ENCOUNTER — Ambulatory Visit: Payer: BC Managed Care – PPO | Admitting: Orthopedic Surgery

## 2023-08-30 VITALS — BP 136/85 | HR 66 | Ht 71.0 in | Wt 240.0 lb

## 2023-08-30 DIAGNOSIS — F419 Anxiety disorder, unspecified: Secondary | ICD-10-CM | POA: Insufficient documentation

## 2023-08-30 DIAGNOSIS — J449 Chronic obstructive pulmonary disease, unspecified: Secondary | ICD-10-CM | POA: Insufficient documentation

## 2023-08-30 DIAGNOSIS — M1711 Unilateral primary osteoarthritis, right knee: Secondary | ICD-10-CM

## 2023-08-30 DIAGNOSIS — G8929 Other chronic pain: Secondary | ICD-10-CM | POA: Insufficient documentation

## 2023-08-30 DIAGNOSIS — M170 Bilateral primary osteoarthritis of knee: Secondary | ICD-10-CM

## 2023-08-30 DIAGNOSIS — E785 Hyperlipidemia, unspecified: Secondary | ICD-10-CM | POA: Insufficient documentation

## 2023-08-30 DIAGNOSIS — E039 Hypothyroidism, unspecified: Secondary | ICD-10-CM | POA: Insufficient documentation

## 2023-08-30 DIAGNOSIS — M4310 Spondylolisthesis, site unspecified: Secondary | ICD-10-CM | POA: Insufficient documentation

## 2023-08-30 DIAGNOSIS — E559 Vitamin D deficiency, unspecified: Secondary | ICD-10-CM | POA: Insufficient documentation

## 2023-08-30 DIAGNOSIS — I10 Essential (primary) hypertension: Secondary | ICD-10-CM | POA: Insufficient documentation

## 2023-08-30 DIAGNOSIS — M1712 Unilateral primary osteoarthritis, left knee: Secondary | ICD-10-CM

## 2023-08-30 DIAGNOSIS — M5410 Radiculopathy, site unspecified: Secondary | ICD-10-CM | POA: Insufficient documentation

## 2023-08-30 MED ORDER — METHYLPREDNISOLONE ACETATE 40 MG/ML IJ SUSP
40.0000 mg | Freq: Once | INTRAMUSCULAR | Status: AC
  Administered 2023-08-30: 40 mg via INTRA_ARTICULAR

## 2023-08-30 NOTE — Progress Notes (Signed)
Office Visit Note   Patient: Thomas Curtis           Date of Birth: 11-Dec-1960           MRN: [id number] Visit Date: 08/30/2023 Requested by: Irena Reichmann, DO [address] [place],  [place] [address] PCP: Pearson Grippe, MD   Assessment & Plan:   Encounter Diagnoses  Name Primary?   Bilateral chronic knee pain Yes   Primary osteoarthritis of right knee    Primary osteoarthritis of left knee     Meds ordered this encounter  Medications   methylPREDNISolone acetate (DEPO-MEDROL) injection 40 mg   methylPREDNISolone acetate (DEPO-MEDROL) injection 40 mg    Bilateral knee injections  Procedure note for bilateral knee injections  Procedure note left knee injection verbal consent was obtained to inject left knee joint  Timeout was completed to confirm the site of injection  The medications used were 40 mg depomedrol and 3 cc of 1% lidocaine  Anesthesia was provided by ethyl chloride and the skin was prepped with alcohol.  After cleaning the skin with alcohol a 20-gauge needle was used to inject the left knee joint. There were no complications. A sterile bandage was applied.   Procedure note right knee injection verbal consent was obtained to inject right knee joint  Timeout was completed to confirm the site of injection  The medications used were 40 mg depomedrol and 3 cc of 1% lidocaine  Anesthesia was provided by ethyl chloride and the skin was prepped with alcohol.  After cleaning the skin with alcohol a 20-gauge needle was used to inject the right knee joint. There were no complications. A sterile bandage was applied.   62 year old male has had NSAIDs for his bilateral knee pain from OA has not had injections.  Try injections come back 3 months if no improvement reconsider knee replacement   Subjective: Chief Complaint  Patient presents with   Knee Pain    Patient is in today with bil knee pain  it has been going on 2-3 years and becoming  worse.  He can not stand long periods at a time, can not walk longer than 10 minutes without pain  had mri 1 year ago and x rays 6 months ago for disability.  He feels like his knees are going outward     HPI: 62 year old male bilateral knee pain.  The pain is on the medial side of the joint.  It is constant and is unrelieved by anti-inflammatories.  There is no history of trauma              ROS: Negative for chest pain or shortness of breath; back pain with pinched nerve has an MRI that will not be addressed by me he does not want any treatment for that at this time he does have some bilateral posterior leg pain   Images personally read and my interpretation : See reports  Visit Diagnoses:  1. Bilateral chronic knee pain   2. Primary osteoarthritis of right knee   3. Primary osteoarthritis of left knee      Follow-Up Instructions: Return in about 3 months (around 11/28/2023) for FOLLOW UP.    Objective: Vital Signs: BP 136/85 (Cuff Size: Large)   Pulse 66   Ht 5\' 11"  (1.803 m)   Wt 240 lb (108.9 kg)   BMI 33.47 kg/m   Physical Exam Vitals and nursing note reviewed.  Constitutional:      Appearance: Normal appearance.  HENT:     Head: Normocephalic and atraumatic.  Eyes:     General: No scleral icterus.       Right eye: No discharge.        Left eye: No discharge.     Extraocular Movements: Extraocular movements intact.     Conjunctiva/sclera: Conjunctivae normal.     Pupils: Pupils are equal, round, and reactive to light.  Cardiovascular:     Rate and Rhythm: Normal rate.     Pulses: Normal pulses.  Skin:    General: Skin is warm and dry.     Capillary Refill: Capillary refill takes less than 2 seconds.  Neurological:     General: No focal deficit present.     Mental Status: He is alert and oriented to person, place, and time.  Psychiatric:        Mood and Affect: Mood normal.        Behavior: Behavior normal.        Thought Content: Thought content normal.         Judgment: Judgment normal.      Ortho Exam Medial joint line tenderness no effusions  Slight varus alignment  Range of motion is normal for flexion slight less than 5 degree flexion contractures on extension    Specialty Comments:  No specialty comments available.  Imaging: DG Knee AP/LAT W/Sunrise Right Result Date: 08/30/2023 Right knee x-ray 3 views Again we see a varus trend towards alignment on the knee with medial compartment narrowing which is moderate to severe patellofemoral alignment is normal Subchondral sclerosis without osteophytes of any significant degree Impression moderate OA right knee   DG Knee AP/LAT W/Sunrise Left Result Date: 08/30/2023 Left knee 3 views narrowing of the medial compartment the patient is trending towards a varus knee normal patellofemoral alignment subchondral sclerosis without significant osteophytes Moderate to severe joint space narrowing moderate to severe OA     PMFS History: Patient Active Problem List   Diagnosis Date Noted   Acquired spondylolisthesis 08/30/2023   Anxiety 08/30/2023   Chronic back pain 08/30/2023   Hyperlipidemia, unspecified 08/30/2023   Hypertension 08/30/2023   Hypothyroid 08/30/2023   Radiculopathy 08/30/2023   Vitamin D deficiency 08/30/2023   Chronic obstructive pulmonary disease (HCC) 08/30/2023   Migraine with aura and with status migrainosus, not intractable 03/05/2016   Erectile dysfunction 04/19/2015   Smoker 04/19/2015   Precordial pain 03/09/2013   GERD (gastroesophageal reflux disease) 10/29/2011   Chronic anxiety 10/29/2011   Colon polyps 10/29/2011   Past Medical History:  Diagnosis Date   Anginal pain (HCC)    Anxiety    Chicken pox    Colon polyps 2010   Depression    GERD (gastroesophageal reflux disease)    Headache     Family History  Problem Relation Age of Onset   Hypertension Mother    Hypertension Father    Cancer Father        colon, lung, prostate   Migraines  Neg Hx     Past Surgical History:  Procedure Laterality Date   None     Social History   Occupational History   Occupation: CARPENTER at [employer]  Tobacco Use   Smoking status: Every Day    Current packs/day: 1.00    Average packs/day: 1 pack/day for 35.0 years (35.0 ttl pk-yrs)    Types: Cigarettes   Smokeless tobacco: Not on file  Substance and Sexual Activity   Alcohol use: Yes    Comment: Does no  abuse   Drug use: No   Sexual activity: Not on file

## 2023-11-29 ENCOUNTER — Ambulatory Visit: Payer: BC Managed Care – PPO | Admitting: Orthopedic Surgery

## 2023-12-06 ENCOUNTER — Ambulatory Visit: Admitting: Orthopedic Surgery

## 2023-12-16 ENCOUNTER — Ambulatory Visit: Admitting: Orthopedic Surgery

## 2023-12-16 DIAGNOSIS — M170 Bilateral primary osteoarthritis of knee: Secondary | ICD-10-CM

## 2023-12-16 DIAGNOSIS — M25562 Pain in left knee: Secondary | ICD-10-CM

## 2023-12-16 DIAGNOSIS — M1711 Unilateral primary osteoarthritis, right knee: Secondary | ICD-10-CM

## 2023-12-16 DIAGNOSIS — M1712 Unilateral primary osteoarthritis, left knee: Secondary | ICD-10-CM

## 2023-12-16 MED ORDER — METHYLPREDNISOLONE ACETATE 40 MG/ML IJ SUSP
40.0000 mg | Freq: Once | INTRAMUSCULAR | Status: AC
  Administered 2023-12-16: 40 mg via INTRA_ARTICULAR

## 2023-12-16 NOTE — Patient Instructions (Addendum)
 Injections:  Cortisone Viscosupplementation (gel shot) PRP Stem cells  Viscosupplementation Treatment for Knee Arthritis  Osteoarthritis of the knee is one of the leading causes of disability in the [place]. It develops slowly and the pain it causes worsens over time. Although there is no cure for osteoarthritis, there are many treatment options available to help people manage pain and stay active.  In its early stages, arthritis of the knee is treated with nonsurgical methods. Your doctor may recommend a range of treatments, including:  Modifying your activities Weight loss Pain relievers, such as acetaminophen or nonsteroidal anti-inflammatory drugs (NSAIDs) like ibuprofen Physical therapy Corticosteroid injections Another treatment option is a procedure called viscosupplementation. If you have tried all other nonsurgical treatment methods and your pain continues to limit your activities, viscosupplementation may be an option.  In this procedure, a gel-like fluid called hyaluronic acid is injected into the knee joint. Hyaluronic acid is a naturally occurring substance found in the synovial fluid surrounding joints. It acts as a lubricant to enable bones to move smoothly over each other and as a shock absorber for joint loads. People with osteoarthritis have a lower-than-normal concentration of hyaluronic acid in their joints. The theory is that adding hyaluronic acid to the arthritic joint will facilitate movement and reduce pain.  The most recent research, however, has not found viscosupplementation to be effective at significantly reducing pain or improving function. Although some patients report pain relief with the procedure, some people are not helped by the injections.  Viscosupplementation was first used in [place] and [place], and was approved by the U.S. Food and Drug Administration in 1997. Several preparations of hyaluronic acid are now commercially  available.  Procedure Depending on the product used, you will receive one to five shots over several weeks.  During the procedure, if there is any swelling in your knee, your doctor may remove (aspirate) the excess fluids before injecting the hyaluronic acid. Usually, the aspiration and the injection are done using only one needle injected into the joint, Some doctors may prefer to use two separate syringes.  For the first 48 hours after the shot, you should avoid excessive activity, such as jogging or heavy lifting.  Side Effects You may notice a local reaction, such as pain, warmth, and slight swelling immediately after the shot. These symptoms generally do not last long. You may want to apply an ice pack to help ease them.  Complications Rarely, patients may develop a local allergy-like reaction in the knee. In these cases, the knee may become full of fluid, red, warm, and painful. If this occurs, contact your doctor immediately.  Infection and bleeding are also very rare complications of this procedure.  Outcomes As is noted above, some patients will not be helped by viscosupplementation. For those who report pain relief with the procedure, it may take several weeks to notice an improvement. How long the effects last varies. Some patients report pain relieving effects for several months following the injections.  If the injections are effective they may be repeated after a period of time, usually 6 months.  Although some patients report relief of arthritis symptoms with viscosupplementation, the procedure has never been shown to reverse the arthritic process or re-grow cartilage.  The effectiveness of viscosupplementation in treating arthritis is not clear. It has been proposed that viscosupplementation is most effective if the arthritis is in its early stages (mild to moderate), but more research is needed to support this.

## 2023-12-16 NOTE — Addendum Note (Signed)
 Addended by: Baird Kay on: 12/16/2023 04:34 PM   Modules accepted: Orders

## 2023-12-16 NOTE — Progress Notes (Signed)
   There were no vitals taken for this visit.  There is no height or weight on file to calculate BMI.  Chief Complaint  Patient presents with   Knee Pain    Encounter Diagnoses  Name Primary?   Primary osteoarthritis of right knee Yes   Primary osteoarthritis of left knee    Bilateral chronic knee pain    Improved  Pt states previous shots worked well until a couple weeks ago and pain is slowly coming back.

## 2023-12-16 NOTE — Progress Notes (Signed)
 This is a 63 year old male who got 2 injections for osteoarthritis of the knee and did well until about a week or 2 ago started having some pain  Presents with bilateral knee pain  Exam shows normal range of motion in both knees no effusion no warmth to the joint no erythema.  As noted prior both knees are stable  We talked about various injections including PRP, stem cells, gel shots, cortisone injections.  We both decided that cortisone injections were in order and repeated the injections  Procedure note left knee injection   verbal consent was obtained to inject left knee joint  Timeout was completed to confirm the site of injection  The medications used were depomedrol 40 mg and 1% lidocaine 3 cc Anesthesia was provided by ethyl chloride and the skin was prepped with alcohol.  After cleaning the skin with alcohol a 20-gauge needle was used to inject the left knee joint. There were no complications. A sterile bandage was applied.    Procedure note right knee injection   verbal consent was obtained to inject right knee joint  Timeout was completed to confirm the site of injection  The medications used were depomedrol 40 mg and 1% lidocaine 3 cc Anesthesia was provided by ethyl chloride and the skin was prepped with alcohol.  After cleaning the skin with alcohol a 20-gauge needle was used to inject the right knee joint. There were no complications. A sterile bandage was applied.   Encounter Diagnoses  Name Primary?   Primary osteoarthritis of right knee Yes   Primary osteoarthritis of left knee    Bilateral chronic knee pain      There were no vitals taken for this visit.  There is no height or weight on file to calculate BMI.  Chief Complaint  Patient presents with   Knee Pain    Encounter Diagnoses  Name Primary?   Primary osteoarthritis of right knee Yes   Primary osteoarthritis of left knee    Bilateral chronic knee pain    Improved  Pt states previous  shots worked well until a couple weeks ago and pain is slowly coming back.

## 2024-06-22 ENCOUNTER — Encounter: Payer: Self-pay | Admitting: Orthopedic Surgery

## 2024-06-22 ENCOUNTER — Ambulatory Visit: Admitting: Orthopedic Surgery

## 2024-06-22 VITALS — Ht 72.0 in | Wt 237.0 lb

## 2024-06-22 DIAGNOSIS — M545 Low back pain, unspecified: Secondary | ICD-10-CM

## 2024-06-22 DIAGNOSIS — M170 Bilateral primary osteoarthritis of knee: Secondary | ICD-10-CM | POA: Diagnosis not present

## 2024-06-22 NOTE — Progress Notes (Signed)
    06/22/2024   Chief Complaint  Patient presents with   Knee Pain    Both, are doing ok currently     No diagnosis found.  What pharmacy do you use ? __[facility]_________________________  DOI/DOS/ Date:    Improved

## 2024-06-22 NOTE — Progress Notes (Signed)
    06/22/2024   Chief Complaint  Patient presents with   Knee Pain    Both, are doing ok currently     Encounter Diagnoses  Name Primary?   Lumbar pain Yes   Primary osteoarthritis of both knees     What pharmacy do you use ? __[facility]_________  DOI/DOS/ Date:    Improved  63 year old male with osteoarthritis of both knees grade 3 if not for currently just taking occasional medication such as Tylenol  and gabapentin for his knee pain.  He says his left knee gets stiff the right knee does not both knees have intermittent mild discomfort  He is complaining of some lower back and hip pain which is basically in the lower lumbar spine without radiation.  He says he gets 2 out of 10 pain there and would like that evaluated  As far as his knees go he has minimal if any flexion contracture on the right or left knee the right knee flexes 120 degrees the left maybe 118.  Both knees are stable, no effusion, no erythema  Right now he does not need anything for the knees I did recommend some hyaluronic acid injections and we can go ahead and pre-CERT him for that so that when his knees get symptomatic we can start doing that  As for his back pain I referred him to a back specialist  Encounter Diagnoses  Name Primary?   Lumbar pain Yes   Primary osteoarthritis of both knees

## 2024-06-22 NOTE — Patient Instructions (Addendum)
 We are referring you to [facility] from [facility] address is [address] [place] The phone number is [phone]  The office will call you with an appointment Dr.  Georgina   We will check which brand of Hyaluronic acid injections (there are several) your insurance covers. We will call you with price and schedule with you if insurance approves and you are okay with the out of pocket costs. If for any reason they will not cover or the out of pocket costs are high, we will discuss with you and let you know other options available. This process normally takes several weeks to hear back from us  the insurance approval process takes time.

## 2024-07-27 ENCOUNTER — Ambulatory Visit: Admitting: Orthopedic Surgery

## 2024-07-27 ENCOUNTER — Other Ambulatory Visit (INDEPENDENT_AMBULATORY_CARE_PROVIDER_SITE_OTHER): Payer: Self-pay

## 2024-07-27 DIAGNOSIS — M5416 Radiculopathy, lumbar region: Secondary | ICD-10-CM | POA: Diagnosis not present

## 2024-07-27 DIAGNOSIS — M545 Low back pain, unspecified: Secondary | ICD-10-CM

## 2024-07-27 DIAGNOSIS — Z720 Tobacco use: Secondary | ICD-10-CM | POA: Diagnosis not present

## 2024-07-27 MED ORDER — CYCLOBENZAPRINE HCL 10 MG PO TABS: 10.0000 mg | ORAL_TABLET | Freq: Three times a day (TID) | ORAL | 0 refills | Status: AC | PRN

## 2024-07-27 NOTE — Progress Notes (Addendum)
 Orthopedic Spine Surgery Office Note  Assessment: Patient is a 63 y.o. male with chronic, low back and left buttock pain.  No pain rating passive buttock.  Has a spondylolisthesis at L4/5 and bilateral foraminal stenosis   Plan: -Explained that initially conservative treatment is tried as a significant number of patients may experience relief with these treatment modalities. Discussed that the conservative treatments include:  -activity modification  -physical therapy  -over the counter pain medications  -medrol  dosepak  -lumbar steroid injections -Patient has tried PT, home exercise program, gabapentin, diclofenac  -Recommended lumbar steroid injection.  Prescribed Flexeril  for additional pain relief -Patient has had several friends that did not have good outcomes with spine surgery, so he is not interested in any kind of surgery.  Pain management may be a better option for him if pain persists -Would need to be nicotine  free prior to any elective spine surgery -Patient should return to office on an as-needed basis   I spent talking to the patient about the health risks associated with smoking and encouraged cessation. Explained that from a surgical perspective, smoking increases the risk of infection, wound healing complications, and nonunion. Patient expressed understanding but is not quite ready to quit.    ___________________________________________________________________________   History:  Patient is a 63 y.o. male who presents today for lumbar spine.  Patient has had several years of low back pain that goes into the left buttock.  He describes it as a mild pain for the most part.  At times he gets more severe.  There are some times that it wakes him up at night.  At its worst, he rates the pain as a 7 out of 10.  He does not have any pain radiating past the buttock on the left side.  He has no pain radiating into the right lower extremity.  He notes the pain on a daily basis.   There was no trauma or injury that preceded the onset of the pain.   Weakness: Denies Symptoms of imbalance: Denies Paresthesias and numbness: Denies Bowel or bladder incontinence: Denies Saddle anesthesia: Denies  Treatments tried: PT, home exercise program, gabapentin, diclofenac   Review of systems: Denies fevers and chills, night sweats, unexplained weight loss, history of cancer.  Has had pain that wakes him at night  Past medical history: Depression/anxiety GERD Chronic pain Migraines HTN  Allergies: lisinopril  Past surgical history:  None  Social history: Reports use of nicotine  product (smoking, vaping, patches, smokeless) Alcohol use: Rare Reports marijuana use, denies other recreational drug use   Physical Exam:  General: no acute distress, appears stated age Neurologic: alert, answering questions appropriately, following commands Respiratory: unlabored breathing on room air, symmetric chest rise Psychiatric: appropriate affect, normal cadence to speech   MSK (spine):  -Strength exam      Left  Right EHL    5/5  5/5 TA    5/5  5/5 GSC    5/5  5/5 Knee extension  5/5  5/5 Hip flexion   5/5  5/5  -Sensory exam    Sensation intact to light touch in L3-S1 nerve distributions of bilateral lower extremities  -Achilles DTR: 2/4 on the left, 2/4 on the right -Patellar tendon DTR: 2/4 on the left, 2/4 on the right  -Straight leg raise: negative bilaterally  -Clonus: no beats bilaterally  -Left hip exam: no pain through range of motion -Right hip exam: no pain through range of motion  Imaging: XRs of the lumbar spine from 07/27/2024  were independently reviewed and interpreted, showing grade 1 spondylolisthesis at L4/5.  Disc height loss at L4/5.  Disc at loss at L5/S1.  Disc height loss with small anterior osteophyte formation at L1/2.  No fracture or dislocation seen.  MRI of the lumbar spine from 10/31/2021 was independently reviewed and  interpreted, showing spondylolisthesis at L4/5 with bilateral foraminal stenosis at that level.  Bilateral foraminal stenosis at L5/S1.  DDD at L4/5 and L5/S1.   Patient name: Thomas Curtis Patient MRN: [id number] Date of visit: 07/27/24

## 2024-08-09 ENCOUNTER — Telehealth: Payer: Self-pay

## 2024-08-09 ENCOUNTER — Telehealth: Payer: Self-pay | Admitting: Physical Medicine and Rehabilitation

## 2024-08-09 ENCOUNTER — Other Ambulatory Visit (HOSPITAL_COMMUNITY): Payer: Self-pay | Admitting: Family Medicine

## 2024-08-09 DIAGNOSIS — J449 Chronic obstructive pulmonary disease, unspecified: Secondary | ICD-10-CM

## 2024-08-09 DIAGNOSIS — M5416 Radiculopathy, lumbar region: Secondary | ICD-10-CM

## 2024-08-09 DIAGNOSIS — F17200 Nicotine dependence, unspecified, uncomplicated: Secondary | ICD-10-CM

## 2024-08-09 NOTE — Telephone Encounter (Signed)
 Pt called to see if back injection has been approved. Please call pt at [phone].

## 2024-08-09 NOTE — Telephone Encounter (Signed)
 Sent back to Dr. Georgina and Bari to advised next step.

## 2024-08-09 NOTE — Telephone Encounter (Signed)
 Patient asking when he can get injection, however it look like Dr. Georgina put it in than closed it can you check and see if he wants to open it back up or is he sending patient somewhere else?

## 2024-08-09 NOTE — Telephone Encounter (Signed)
 We are waiting on Dr. Georgina and Bari to Respond

## 2024-08-09 NOTE — Addendum Note (Signed)
 Addended by: GEORGINA SHARPER on: 08/09/2024 05:11 PM   Modules accepted: Orders

## 2024-08-18 ENCOUNTER — Ambulatory Visit: Admitting: Orthopedic Surgery

## 2024-08-18 ENCOUNTER — Encounter: Payer: Self-pay | Admitting: Orthopedic Surgery

## 2024-08-18 DIAGNOSIS — M1711 Unilateral primary osteoarthritis, right knee: Secondary | ICD-10-CM

## 2024-08-18 DIAGNOSIS — M1712 Unilateral primary osteoarthritis, left knee: Secondary | ICD-10-CM

## 2024-08-18 MED ORDER — HYALURONAN 30 MG/2ML IX SOSY
30.0000 mg | PREFILLED_SYRINGE | INTRA_ARTICULAR | Status: AC
  Administered 2024-08-18 – 2024-09-01 (×3): 30 mg via INTRA_ARTICULAR

## 2024-08-18 NOTE — Progress Notes (Signed)
 Patient: Thomas Curtis           Date of Birth: 10-02-1960           MRN: [id number] Visit Date: 08/18/2024 Requested by: Gerome Brunet, DO [address] [place],  [place] [address] PCP: Gerome Brunet, DO  Chief Complaint  Patient presents with   Injections    Both knees Orthovisc 1    No diagnosis found.  The patient has consented to and requested hyaluronic acid injection   MEDICATION: Orthovisc  left  THE knee is prepped with alcohol and ethyl chloride  The injection is performed with a 21-gauge needle, via inferolateral approach  No complications were noted  Appropriate instructions post injection were given   right  THE knee is prepped with alcohol and ethyl chloride  The injection is performed with a 21-gauge needle, via inferolateral approach  No complications were noted  Appropriate instructions post injection were given  Return 1 week

## 2024-08-24 ENCOUNTER — Telehealth: Payer: Self-pay | Admitting: Physical Medicine and Rehabilitation

## 2024-08-24 NOTE — Telephone Encounter (Signed)
 Patient called. Would like to know why the referral was canceled to see Dr,  Eldonna,.

## 2024-08-25 ENCOUNTER — Other Ambulatory Visit: Payer: Self-pay | Admitting: Orthopedic Surgery

## 2024-08-25 ENCOUNTER — Encounter: Payer: Self-pay | Admitting: Orthopedic Surgery

## 2024-08-25 ENCOUNTER — Ambulatory Visit: Admitting: Orthopedic Surgery

## 2024-08-25 DIAGNOSIS — M5416 Radiculopathy, lumbar region: Secondary | ICD-10-CM

## 2024-08-25 DIAGNOSIS — M170 Bilateral primary osteoarthritis of knee: Secondary | ICD-10-CM | POA: Diagnosis not present

## 2024-08-25 DIAGNOSIS — M545 Low back pain, unspecified: Secondary | ICD-10-CM

## 2024-08-25 NOTE — Telephone Encounter (Signed)
 Dr Carliss staff said [facility] patient ready for injection as soon as possible, I sent the order there and gave patient number to call and spoke to [facility] to verify they have the order  FYI Only, this is  done

## 2024-08-25 NOTE — Progress Notes (Signed)
° °  Procedure note for injection   Chief Complaint  Patient presents with   Injections    Both knees Orthovisc 2     Encounter Diagnosis  Name Primary?   Primary osteoarthritis of both knees Yes        The patient has consented for injection of the joint: knee right and left  Medication: Depo-Medrol  40 mg and lidocaine 1%  Time out completed: Yes  The site of injection left knee was cleaned with alcohol and ethyl chloride.  The injection was given without any complications appropriate precautions were given.  The procedure was done on the opposite knee as well, right knee  Follow-up in a week for Orthovisc 3

## 2024-08-25 NOTE — Telephone Encounter (Signed)
 I put in the referral to [facility] when he was here for some knee injections, I gave him the number to called and called [facility]

## 2024-08-28 ENCOUNTER — Ambulatory Visit (HOSPITAL_COMMUNITY)

## 2024-08-29 ENCOUNTER — Telehealth: Payer: Self-pay | Admitting: Orthopedic Surgery

## 2024-08-29 NOTE — Telephone Encounter (Signed)
 I gave him information forDRI for [facility] he said he thought it was for xray I told him no its for the injection, he states he will call now, he didn't understand before but now he voiced understanding

## 2024-08-29 NOTE — Telephone Encounter (Signed)
 Dr. Areatha pt - pt lvm requesting that Amy call him [phone].  He stated that Amy helped him setup an appointment last week and he thinks it's to the wrong place.

## 2024-09-01 ENCOUNTER — Encounter: Payer: Self-pay | Admitting: Orthopedic Surgery

## 2024-09-01 ENCOUNTER — Ambulatory Visit: Admitting: Orthopedic Surgery

## 2024-09-01 DIAGNOSIS — M25561 Pain in right knee: Secondary | ICD-10-CM

## 2024-09-01 DIAGNOSIS — M1711 Unilateral primary osteoarthritis, right knee: Secondary | ICD-10-CM | POA: Diagnosis not present

## 2024-09-01 DIAGNOSIS — G8929 Other chronic pain: Secondary | ICD-10-CM

## 2024-09-01 DIAGNOSIS — M25562 Pain in left knee: Secondary | ICD-10-CM

## 2024-09-01 DIAGNOSIS — M1712 Unilateral primary osteoarthritis, left knee: Secondary | ICD-10-CM

## 2024-09-01 DIAGNOSIS — M170 Bilateral primary osteoarthritis of knee: Secondary | ICD-10-CM

## 2024-09-01 NOTE — Progress Notes (Signed)
 Patient: Thomas Curtis           Date of Birth: December 07, 1960           MRN: [id number] Visit Date: 09/01/2024 Requested by: Gerome Brunet, DO [address] [place],  [place] [address] PCP: Gerome Brunet, DO  Chief Complaint  Patient presents with   Injections    BOTH KNEES ORTHOVISC 3    Encounter Diagnoses  Name Primary?   Primary osteoarthritis of both knees Yes   Primary osteoarthritis of left knee    Bilateral chronic knee pain     The patient has consented to and requested hyaluronic acid injection     right  THE knee is prepped with alcohol and ethyl chloride  The injection is performed with a 21-gauge needle, via inferolateral approach  No complications were noted  Appropriate instructions post injection were given   Procedure note for injection of hyaluronic acid   Diagnosis osteoarthritis of the knee  Verbal consent was obtained to inject the knee with HYALURONIC ACID . Timeout was completed to confirm the injection site as the left   Knee  Ethyl chloride spray was used for anesthesia Alcohol was used to prep the skin. The infrapatellar lateral portal was used as an injection site and 1 vial of hyaluronic acid  was injected into the knee    No complications were noted

## 2024-09-01 NOTE — Patient Instructions (Signed)
 Instructions for further injections of hyaluronic acid  If you feel these injections have helped your knee we can repeat them in 6 months.  We do asked that you call us  5 months from now which would be May to let us  know that she would like the injections done again so that we may get appropriate insurance precertification and let you know what the co-pay or out-of-pocket cost would be  Thank you

## 2024-09-04 NOTE — Discharge Instructions (Signed)

## 2024-09-05 ENCOUNTER — Inpatient Hospital Stay
Admission: RE | Admit: 2024-09-05 | Discharge: 2024-09-05 | Disposition: A | Discharge status: Home / Self Care | Source: Ambulatory Visit | Attending: Orthopedic Surgery | Admitting: Orthopedic Surgery

## 2024-09-12 NOTE — Discharge Instructions (Signed)

## 2024-09-13 ENCOUNTER — Ambulatory Visit
Admission: RE | Admit: 2024-09-13 | Discharge: 2024-09-13 | Disposition: A | Discharge status: Home / Self Care | Source: Ambulatory Visit | Attending: Orthopedic Surgery | Admitting: Orthopedic Surgery

## 2024-09-13 DIAGNOSIS — M5416 Radiculopathy, lumbar region: Secondary | ICD-10-CM

## 2024-09-13 MED ORDER — IOPAMIDOL (ISOVUE-M 200) INJECTION 41%
1.0000 mL | Freq: Once | INTRAMUSCULAR | Status: AC
  Administered 2024-09-13: 1 mL via EPIDURAL

## 2024-09-13 MED ORDER — METHYLPREDNISOLONE ACETATE 40 MG/ML INJ SUSP (RADIOLOG
80.0000 mg | Freq: Once | INTRAMUSCULAR | Status: AC
  Administered 2024-09-13: 80 mg via EPIDURAL

## 2024-09-21 ENCOUNTER — Ambulatory Visit (HOSPITAL_COMMUNITY)
Admission: RE | Admit: 2024-09-21 | Discharge: 2024-09-21 | Disposition: A | Discharge status: Home / Self Care | Source: Ambulatory Visit | Attending: Family Medicine | Admitting: Family Medicine

## 2024-09-21 DIAGNOSIS — Z122 Encounter for screening for malignant neoplasm of respiratory organs: Secondary | ICD-10-CM | POA: Diagnosis not present

## 2024-09-21 DIAGNOSIS — I700 Atherosclerosis of aorta: Secondary | ICD-10-CM | POA: Diagnosis not present

## 2024-09-21 DIAGNOSIS — F17200 Nicotine dependence, unspecified, uncomplicated: Secondary | ICD-10-CM | POA: Diagnosis present

## 2024-09-21 DIAGNOSIS — I2510 Atherosclerotic heart disease of native coronary artery without angina pectoris: Secondary | ICD-10-CM | POA: Diagnosis not present

## 2024-09-21 DIAGNOSIS — J439 Emphysema, unspecified: Secondary | ICD-10-CM | POA: Diagnosis not present

## 2024-09-21 DIAGNOSIS — J449 Chronic obstructive pulmonary disease, unspecified: Secondary | ICD-10-CM | POA: Diagnosis present

## 2024-09-21 DIAGNOSIS — F17210 Nicotine dependence, cigarettes, uncomplicated: Secondary | ICD-10-CM | POA: Diagnosis not present
